# Patient Record
Sex: Female | Born: 1985 | Race: White | Hispanic: No | Marital: Married | Smoking: Never smoker
Health system: Southern US, Community
[De-identification: ages and names within clinical notes are randomized; demographics above are authoritative.]

## PROBLEM LIST (undated history)

## (undated) DIAGNOSIS — R87629 Unspecified abnormal cytological findings in specimens from vagina: Secondary | ICD-10-CM

## (undated) HISTORY — DX: Unspecified abnormal cytological findings in specimens from vagina: R87.629

## (undated) HISTORY — PX: NO PAST SURGERIES: SHX2092

---

## 2016-01-12 NOTE — L&D Delivery Note (Signed)
Delivery Note At 11:46 PM a viable and healthy female was delivered via Vaginal, Spontaneous Delivery (Presentation:ROA ;  ).  APGAR: 8, 9; weight pending   .   Placenta status: intact, sent to OB.  Cord:  3 vessel with the following complications: significant lacerations. .   Anesthesia:  Epidural Episiotomy:  None Lacerations:  Complex 3rd degree perineal, 2nd vaginal, vulvar tear clitoris to vagina. Repaired in the usual fashion including rectal sphincter  Suture Repair: Monocryl  Est. Blood Loss (mL):  900  Mom to postpartum.  Baby to Couplet care / Skin to Skin.  [name] 08/21/2016, 12:43 AM  Patient is a G1 at 3635w2d who was admitted with SOL, essentially uncomplicated prenatal course.  She progressed with augmentation via AROM and then Pit at the very end of pushing.  I was gloved and present for delivery in its entirety.  Second stage of labor progressed, baby delivered after approx 2 hrs of pushing.  Mild decels during second stage noted.  Complications: none  Lacerations: see note above; repaired by Dr Despina HiddenEure  EBL: 900cc  [name], [name], CNM 3:18 AM 08/21/2016

## 2016-01-16 ENCOUNTER — Ambulatory Visit (INDEPENDENT_AMBULATORY_CARE_PROVIDER_SITE_OTHER): Payer: Managed Care, Other (non HMO) | Admitting: Family

## 2016-01-16 ENCOUNTER — Encounter: Payer: Self-pay | Admitting: Family

## 2016-01-16 VITALS — BP 114/87 | HR 88 | Ht 61.0 in | Wt 187.0 lb

## 2016-01-16 DIAGNOSIS — O34219 Maternal care for unspecified type scar from previous cesarean delivery: Secondary | ICD-10-CM

## 2016-01-16 DIAGNOSIS — Z3490 Encounter for supervision of normal pregnancy, unspecified, unspecified trimester: Secondary | ICD-10-CM | POA: Insufficient documentation

## 2016-01-16 DIAGNOSIS — O9989 Other specified diseases and conditions complicating pregnancy, childbirth and the puerperium: Secondary | ICD-10-CM

## 2016-01-16 DIAGNOSIS — Z124 Encounter for screening for malignant neoplasm of cervix: Secondary | ICD-10-CM

## 2016-01-16 DIAGNOSIS — O3440 Maternal care for other abnormalities of cervix, unspecified trimester: Secondary | ICD-10-CM | POA: Insufficient documentation

## 2016-01-16 DIAGNOSIS — O3441 Maternal care for other abnormalities of cervix, first trimester: Secondary | ICD-10-CM

## 2016-01-16 DIAGNOSIS — Z3689 Encounter for other specified antenatal screening: Secondary | ICD-10-CM | POA: Diagnosis not present

## 2016-01-16 DIAGNOSIS — O09899 Supervision of other high risk pregnancies, unspecified trimester: Secondary | ICD-10-CM

## 2016-01-16 DIAGNOSIS — Z283 Underimmunization status: Secondary | ICD-10-CM

## 2016-01-16 DIAGNOSIS — Z3491 Encounter for supervision of normal pregnancy, unspecified, first trimester: Secondary | ICD-10-CM | POA: Diagnosis not present

## 2016-01-16 DIAGNOSIS — Z9889 Other specified postprocedural states: Secondary | ICD-10-CM

## 2016-01-16 DIAGNOSIS — Z2839 Other underimmunization status: Secondary | ICD-10-CM

## 2016-01-16 DIAGNOSIS — Z113 Encounter for screening for infections with a predominantly sexual mode of transmission: Secondary | ICD-10-CM

## 2016-01-16 DIAGNOSIS — Z1151 Encounter for screening for human papillomavirus (HPV): Secondary | ICD-10-CM | POA: Diagnosis not present

## 2016-01-16 DIAGNOSIS — Z3401 Encounter for supervision of normal first pregnancy, first trimester: Secondary | ICD-10-CM

## 2016-01-16 LAB — HEMOGLOBIN A1C
Hgb A1c MFr Bld: 4.8 % (ref <5.7)
MEAN PLASMA GLUCOSE: 91 mg/dL

## 2016-01-16 LAB — GLUCOSE, RANDOM: GLUCOSE: 84 mg/dL (ref 65–99)

## 2016-01-16 MED ORDER — DOXYLAMINE-PYRIDOXINE 10-10 MG PO TBEC: DELAYED_RELEASE_TABLET | ORAL | 1 refills | Status: DC

## 2016-01-16 NOTE — Patient Instructions (Addendum)
First Trimester of Pregnancy  The first trimester of pregnancy is from week 1 until the end of week 12 (months 1 through 3). A week after a sperm fertilizes an egg, the egg will implant on the wall of the uterus. This embryo will begin to develop into a baby. Genes from you and your partner are forming the baby. The female genes determine whether the baby is a boy or a girl. At 6-8 weeks, the eyes and face are formed, and the heartbeat can be seen on ultrasound. At the end of 12 weeks, all the baby's organs are formed.   Now that you are pregnant, you will want to do everything you can to have a healthy baby. Two of the most important things are to get good prenatal care and to follow your health care provider's instructions. Prenatal care is all the medical care you receive before the baby's birth. This care will help prevent, find, and treat any problems during the pregnancy and childbirth.  BODY CHANGES  Your body goes through many changes during pregnancy. The changes vary from woman to woman.   · You may gain or lose a couple of pounds at first.  · You may feel sick to your stomach (nauseous) and throw up (vomit). If the vomiting is uncontrollable, call your health care provider.  · You may tire easily.  · You may develop headaches that can be relieved by medicines approved by your health care provider.  · You may urinate more often. Painful urination may mean you have a bladder infection.  · You may develop heartburn as a result of your pregnancy.  · You may develop constipation because certain hormones are causing the muscles that push waste through your intestines to slow down.  · You may develop hemorrhoids or swollen, bulging veins (varicose veins).  · Your breasts may begin to grow larger and become tender. Your nipples may stick out more, and the tissue that surrounds them (areola) may become darker.  · Your gums may bleed and may be sensitive to brushing and flossing.   · Dark spots or blotches (chloasma, mask of pregnancy) may develop on your face. This will likely fade after the baby is born.  · Your menstrual periods will stop.  · You may have a loss of appetite.  · You may develop cravings for certain kinds of food.  · You may have changes in your emotions from day to day, such as being excited to be pregnant or being concerned that something may go wrong with the pregnancy and baby.  · You may have more vivid and strange dreams.  · You may have changes in your hair. These can include thickening of your hair, rapid growth, and changes in texture. Some women also have hair loss during or after pregnancy, or hair that feels dry or thin. Your hair will most likely return to normal after your baby is born.  WHAT TO EXPECT AT YOUR PRENATAL VISITS  During a routine prenatal visit:  · You will be weighed to make sure you and the baby are growing normally.  · Your blood pressure will be taken.  · Your abdomen will be measured to track your baby's growth.  · The fetal heartbeat will be listened to starting around week 10 or 12 of your pregnancy.  · Test results from any previous visits will be discussed.  Your health care provider may ask you:  · How you are feeling.  · If you   are feeling the baby move.  · If you have had any abnormal symptoms, such as leaking fluid, bleeding, severe headaches, or abdominal cramping.  · If you are using any tobacco products, including cigarettes, chewing tobacco, and electronic cigarettes.  · If you have any questions.  Other tests that may be performed during your first trimester include:  · Blood tests to find your blood type and to check for the presence of any previous infections. They will also be used to check for low iron levels (anemia) and Rh antibodies. Later in the pregnancy, blood tests for diabetes will be done along with other tests if problems develop.  · Urine tests to check for infections, diabetes, or protein in the urine.   · An ultrasound to confirm the proper growth and development of the baby.  · An amniocentesis to check for possible genetic problems.  · Fetal screens for spina bifida and Down syndrome.  · You may need other tests to make sure you and the baby are doing well.  · HIV (human immunodeficiency virus) testing. Routine prenatal testing includes screening for HIV, unless you choose not to have this test.  HOME CARE INSTRUCTIONS   Medicines  · Follow your health care provider's instructions regarding medicine use. Specific medicines may be either safe or unsafe to take during pregnancy.  · Take your prenatal vitamins as directed.  · If you develop constipation, try taking a stool softener if your health care provider approves.  Diet  · Eat regular, well-balanced meals. Choose a variety of foods, such as meat or vegetable-based protein, fish, milk and low-fat dairy products, vegetables, fruits, and whole grain breads and cereals. Your health care provider will help you determine the amount of weight gain that is right for you.  · Avoid raw meat and uncooked cheese. These carry germs that can cause birth defects in the baby.  · Eating four or five small meals rather than three large meals a day may help relieve nausea and vomiting. If you start to feel nauseous, eating a few soda crackers can be helpful. Drinking liquids between meals instead of during meals also seems to help nausea and vomiting.  · If you develop constipation, eat more high-fiber foods, such as fresh vegetables or fruit and whole grains. Drink enough fluids to keep your urine clear or pale yellow.  Activity and Exercise  · Exercise only as directed by your health care provider. Exercising will help you:    Control your weight.    Stay in shape.    Be prepared for labor and delivery.  · Experiencing pain or cramping in the lower abdomen or low back is a good sign that you should stop exercising. Check with your health care provider  before continuing normal exercises.  · Try to avoid standing for long periods of time. Move your legs often if you must stand in one place for a long time.  · Avoid heavy lifting.  · Wear low-heeled shoes, and practice good posture.  · You may continue to have sex unless your health care provider directs you otherwise.  Relief of Pain or Discomfort  · Wear a good support bra for breast tenderness.    · Take warm sitz baths to soothe any pain or discomfort caused by hemorrhoids. Use hemorrhoid cream if your health care provider approves.    · Rest with your legs elevated if you have leg cramps or low back pain.  · If you develop varicose veins in your   legs, wear support hose. Elevate your feet for 15 minutes, 3-4 times a day. Limit salt in your diet.  Prenatal Care  · Schedule your prenatal visits by the twelfth week of pregnancy. They are usually scheduled monthly at first, then more often in the last 2 months before delivery.  · Write down your questions. Take them to your prenatal visits.  · Keep all your prenatal visits as directed by your health care provider.  Safety  · Wear your seat belt at all times when driving.  · Make a list of emergency phone numbers, including numbers for family, friends, the hospital, and police and fire departments.  General Tips  · Ask your health care provider for a referral to a local prenatal education class. Begin classes no later than at the beginning of month 6 of your pregnancy.  · Ask for help if you have counseling or nutritional needs during pregnancy. Your health care provider can offer advice or refer you to specialists for help with various needs.  · Do not use hot tubs, steam rooms, or saunas.  · Do not douche or use tampons or scented sanitary pads.  · Do not cross your legs for long periods of time.  · Avoid cat litter boxes and soil used by cats. These carry germs that can cause birth defects in the baby and possibly loss of the fetus by miscarriage or stillbirth.   · Avoid all smoking, herbs, alcohol, and medicines not prescribed by your health care provider. Chemicals in these affect the formation and growth of the baby.  · Do not use any tobacco products, including cigarettes, chewing tobacco, and electronic cigarettes. If you need help quitting, ask your health care provider. You may receive counseling support and other resources to help you quit.  · Schedule a dentist appointment. At home, brush your teeth with a soft toothbrush and be gentle when you floss.  SEEK MEDICAL CARE IF:   · You have dizziness.  · You have mild pelvic cramps, pelvic pressure, or nagging pain in the abdominal area.  · You have persistent nausea, vomiting, or diarrhea.  · You have a bad smelling vaginal discharge.  · You have pain with urination.  · You notice increased swelling in your face, hands, legs, or ankles.  SEEK IMMEDIATE MEDICAL CARE IF:   · You have a fever.  · You are leaking fluid from your vagina.  · You have spotting or bleeding from your vagina.  · You have severe abdominal cramping or pain.  · You have rapid weight gain or loss.  · You vomit blood or material that looks like coffee grounds.  · You are exposed to German measles and have never had them.  · You are exposed to fifth disease or chickenpox.  · You develop a severe headache.  · You have shortness of breath.  · You have any kind of trauma, such as from a fall or a car accident.     This information is not intended to replace advice given to you by your health care provider. Make sure you discuss any questions you have with your health care provider.     Document Released: 12/22/2000 Document Revised: 01/18/2014 Document Reviewed: 11/07/2012  Elsevier Interactive Patient Education ©2017 [employer]

## 2016-01-16 NOTE — Progress Notes (Signed)
  Subjective:    Renee SimmondsRebecca Cove is a G1P0 28101w2d being seen today for her first obstetrical visit.  Pregnancy dated by LMP, confirmed by bedside US today.  Here with FOB Ryan.  Her obstetrical history is significant for history of LEEP. Patient does intend to breast feed. Pregnancy history fully reviewed.  Patient reports nausea and vomiting.  Vitals:   01/16/16 0910 01/16/16 0915  BP: 114/87   Pulse: 88   Weight: 187 lb (84.8 kg)   Height:  5\' 1"  (1.549 m)    HISTORY: OB History  Gravida Para Term Preterm AB Living  1            SAB TAB Ectopic Multiple Live Births               # Outcome Date GA Lbr Len/2nd Weight Sex Delivery Anes PTL Lv  1 Current              Past Medical History:  Diagnosis Date  . Vaginal Pap smear, abnormal    Leep   History reviewed. No pertinent surgical history. Family History  Problem Relation Age of Onset  . Prostate cancer Father   . Diabetes Father   . Alzheimer's disease Maternal Grandmother   . Prostate cancer Maternal Grandfather   . Skin cancer Maternal Grandfather   . Deep vein thrombosis Paternal Grandfather   . Ovarian cancer Maternal Aunt   . Lung cancer Maternal Aunt      Exam   BP 114/87   Pulse 88   Ht 5\' 1"  (1.549 m)   Wt 187 lb (84.8 kg)   LMP 11/12/2015   BMI 35.33 kg/m  Uterine Size: size equals dates  Pelvic Exam:    Perineum: No Hemorrhoids, Normal Perineum   Vulva: normal   Vagina:  normal mucosa, normal discharge, no palpable nodules   pH: Not done   Cervix: no bleeding following Pap, no cervical motion tenderness and no lesions; scarring felt around 5 oclock   Adnexa: normal adnexa and no mass, fullness, tenderness   Bony Pelvis: Adequate  System: Breast:  No nipple retraction or dimpling, No nipple discharge or bleeding, No axillary or supraclavicular adenopathy, Normal to palpation without dominant masses   Skin: normal coloration and turgor, no rashes    Neurologic: negative   Extremities: normal  strength, tone, and muscle mass   HEENT neck supple with midline trachea and thyroid without masses   Mouth/Teeth mucous membranes moist, pharynx normal without lesions   Neck supple and no masses   Cardiovascular: regular rate and rhythm, no murmurs or gallops   Respiratory:  appears well, vitals normal, no respiratory distress, acyanotic, normal RR, neck free of mass or lymphadenopathy, chest clear, no wheezing, crepitations, rhonchi, normal symmetric air entry   Abdomen: soft, non-tender; bowel sounds normal; no masses,  no organomegaly   Urinary: urethral meatus normal     Assessment:    Pregnancy: G1P0 Patient Active Problem List   Diagnosis Date Noted  . Normal pregnancy 01/16/2016  . History of cervical LEEP biopsy affecting care of mother, antepartum 01/16/2016        Plan:     Initial labs drawn.  First Screen ordered. Prenatal vitamins. Problem list reviewed and updated. Genetic Screening discussed First Screen: ordered. Follow up in 4 weeks.  Eino FarberWalidah Elenora FenderKarim 01/16/2016

## 2016-01-16 NOTE — Progress Notes (Signed)
Bedside U/S shows IUP with FHT of 158 BPM and GA is 5164w3d

## 2016-01-16 NOTE — Progress Notes (Signed)
Last pap 1/17 WNL but does have H/O Leep 7/16

## 2016-01-18 LAB — CULTURE, OB URINE
Colony Count: NO GROWTH
Organism ID, Bacteria: NO GROWTH

## 2016-01-19 LAB — PRENATAL PROFILE (SOLSTAS)
Antibody Screen: NEGATIVE
BASOS ABS: 0 {cells}/uL (ref 0–200)
Basophils Relative: 0 %
EOS ABS: 109 {cells}/uL (ref 15–500)
Eosinophils Relative: 1 %
HCT: 40.2 % (ref 35.0–45.0)
HIV: NONREACTIVE
Hemoglobin: 13.3 g/dL (ref 11.7–15.5)
Hepatitis B Surface Ag: NEGATIVE
LYMPHS PCT: 17 %
Lymphs Abs: 1853 cells/uL (ref 850–3900)
MCH: 30.4 pg (ref 27.0–33.0)
MCHC: 33.1 g/dL (ref 32.0–36.0)
MCV: 91.8 fL (ref 80.0–100.0)
MONO ABS: 545 {cells}/uL (ref 200–950)
MPV: 9.6 fL (ref 7.5–12.5)
Monocytes Relative: 5 %
NEUTROS PCT: 77 %
Neutro Abs: 8393 cells/uL — ABNORMAL HIGH (ref 1500–7800)
PLATELETS: 310 10*3/uL (ref 140–400)
RBC: 4.38 MIL/uL (ref 3.80–5.10)
RDW: 13.1 % (ref 11.0–15.0)
RH TYPE: POSITIVE
Rubella: <0.9 Index (ref <0.90)
WBC: 10.9 10*3/uL — ABNORMAL HIGH (ref 3.8–10.8)

## 2016-01-19 LAB — CYTOLOGY - PAP
CHLAMYDIA, DNA PROBE: NEGATIVE
DIAGNOSIS: NEGATIVE
HPV (WINDOPATH): NOT DETECTED
NEISSERIA GONORRHEA: NEGATIVE

## 2016-01-23 LAB — CYSTIC FIBROSIS DIAGNOSTIC STUDY

## 2016-01-30 ENCOUNTER — Encounter (HOSPITAL_COMMUNITY): Payer: Self-pay | Admitting: Family

## 2016-02-11 ENCOUNTER — Encounter (HOSPITAL_COMMUNITY): Payer: Self-pay

## 2016-02-11 ENCOUNTER — Ambulatory Visit (HOSPITAL_COMMUNITY)
Admission: RE | Admit: 2016-02-11 | Discharge: 2016-02-11 | Disposition: A | Discharge status: Home / Self Care | Payer: Managed Care, Other (non HMO) | Source: Ambulatory Visit | Attending: Family | Admitting: Family

## 2016-02-11 DIAGNOSIS — Z3491 Encounter for supervision of normal pregnancy, unspecified, first trimester: Secondary | ICD-10-CM

## 2016-02-11 DIAGNOSIS — Z3A13 13 weeks gestation of pregnancy: Secondary | ICD-10-CM | POA: Insufficient documentation

## 2016-02-11 DIAGNOSIS — Z3682 Encounter for antenatal screening for nuchal translucency: Secondary | ICD-10-CM | POA: Diagnosis present

## 2016-02-13 ENCOUNTER — Ambulatory Visit (INDEPENDENT_AMBULATORY_CARE_PROVIDER_SITE_OTHER): Payer: Managed Care, Other (non HMO) | Admitting: Family

## 2016-02-13 ENCOUNTER — Other Ambulatory Visit: Payer: Self-pay

## 2016-02-13 VITALS — BP 117/81 | HR 83 | Wt 184.0 lb

## 2016-02-13 DIAGNOSIS — Z363 Encounter for antenatal screening for malformations: Secondary | ICD-10-CM

## 2016-02-13 DIAGNOSIS — Z3492 Encounter for supervision of normal pregnancy, unspecified, second trimester: Secondary | ICD-10-CM

## 2016-02-13 DIAGNOSIS — Z3402 Encounter for supervision of normal first pregnancy, second trimester: Secondary | ICD-10-CM

## 2016-02-13 NOTE — Progress Notes (Signed)
   PRENATAL VISIT NOTE  Subjective:  Renee Orozco is a 31 y.o. G1P0 at 37w2dbeing seen today for ongoing prenatal care.  She is currently monitored for the following issues for this high-risk pregnancy and has Normal pregnancy and History of cervical LEEP biopsy affecting care of mother, antepartum on her problem list.  Patient reports nausea.   Notices the difference when she does not take Diclegis.  Taking 2 at night only.  Feels like appetite is returning  . Vag. Bleeding: None.   . Denies leaking of fluid.   The following portions of the patient's history were reviewed and updated as appropriate: allergies, current medications, past family history, past medical history, past social history, past surgical history and problem list. Problem list updated.  Objective:   Vitals:   02/13/16 0825  BP: 117/81  Pulse: 83  Weight: 184 lb (83.5 kg)    Fetal Status: Fetal Heart Rate (bpm): 152         General:  Alert, oriented and cooperative. Patient is in no acute distress.  Skin: Skin is warm and dry. No rash noted.   Cardiovascular: Normal heart rate noted  Respiratory: Normal respiratory effort, no problems with respiration noted  Abdomen: Soft, gravid, appropriate for gestational age.       Pelvic:  Cervical exam deferred        Extremities: Normal range of motion.     Mental Status: Normal mood and affect. Normal behavior. Normal judgment and thought content.   Assessment and Plan:  Pregnancy: G1P0 at 153w2d1. Encounter for supervision of normal first pregnancy in second trimester - [place] COMP + 14 WK; Future - Reviewed OB lab results including being non rubella-immune and needing MMR postpartum - Reviewed NT results, need AFP at next visit  2. Encounter for antenatal screening for malformation using ultrasound - [place] COMP + 1422K; Future   General obstetric precautions including but not limited to vaginal bleeding and pelvic pain reviewed in detail with the  patient. Please refer to After Visit Summary for other counseling recommendations.  Return in about 4 weeks (around 03/12/2016).   WaVenia Carbon Michiel CowboyCNM

## 2016-02-17 ENCOUNTER — Encounter: Payer: Self-pay | Admitting: *Deleted

## 2016-02-19 LAB — PAIN MGMT, PROFILE 6 CONF W/O MM, U

## 2016-02-20 DIAGNOSIS — O09899 Supervision of other high risk pregnancies, unspecified trimester: Secondary | ICD-10-CM | POA: Insufficient documentation

## 2016-02-20 DIAGNOSIS — Z283 Underimmunization status: Secondary | ICD-10-CM | POA: Insufficient documentation

## 2016-02-20 DIAGNOSIS — O9989 Other specified diseases and conditions complicating pregnancy, childbirth and the puerperium: Secondary | ICD-10-CM

## 2016-02-20 DIAGNOSIS — Z2839 Other underimmunization status: Secondary | ICD-10-CM | POA: Insufficient documentation

## 2016-03-07 ENCOUNTER — Telehealth: Payer: Managed Care, Other (non HMO) | Admitting: Family

## 2016-03-07 DIAGNOSIS — R112 Nausea with vomiting, unspecified: Secondary | ICD-10-CM

## 2016-03-07 MED ORDER — ONDANSETRON 4 MG PO TBDP: 4.0000 mg | ORAL_TABLET | Freq: Three times a day (TID) | ORAL | 0 refills | Status: DC | PRN

## 2016-03-07 NOTE — Progress Notes (Signed)

## 2016-03-12 ENCOUNTER — Ambulatory Visit (INDEPENDENT_AMBULATORY_CARE_PROVIDER_SITE_OTHER): Payer: Managed Care, Other (non HMO) | Admitting: Advanced Practice Midwife

## 2016-03-12 VITALS — BP 106/71 | HR 72 | Wt 187.0 lb

## 2016-03-12 DIAGNOSIS — Z3492 Encounter for supervision of normal pregnancy, unspecified, second trimester: Secondary | ICD-10-CM

## 2016-03-12 DIAGNOSIS — Z3402 Encounter for supervision of normal first pregnancy, second trimester: Secondary | ICD-10-CM

## 2016-03-12 NOTE — Patient Instructions (Signed)

## 2016-03-12 NOTE — Progress Notes (Signed)
   PRENATAL VISIT NOTE  Subjective:  Renee SimmondsRebecca Orozco is a 31 y.o. G1P0 at 6413w2d being seen today for ongoing prenatal care.  She is currently monitored for the following issues for this low-risk pregnancy and has Normal pregnancy; History of cervical LEEP biopsy affecting care of mother, antepartum; and Rubella non-immune status, antepartum on her problem list.  Patient reports no complaints.  Contractions: Not present. Vag. Bleeding: None.  Movement: Present. Denies leaking of fluid.   The following portions of the patient's history were reviewed and updated as appropriate: allergies, current medications, past family history, past medical history, past social history, past surgical history and problem list. Problem list updated.  Objective:   Vitals:   03/12/16 0808  BP: 106/71  Pulse: 72  Weight: 187 lb (84.8 kg)    Fetal Status: Fetal Heart Rate (bpm): 140 Fundal Height: 18 cm Movement: Present     General:  Alert, oriented and cooperative. Patient is in no acute distress.  Skin: Skin is warm and dry. No rash noted.   Cardiovascular: Normal heart rate noted  Respiratory: Normal respiratory effort, no problems with respiration noted  Abdomen: Soft, gravid, appropriate for gestational age. Pain/Pressure: Absent     Pelvic:  Cervical exam deferred        Extremities: Normal range of motion.  Edema: None  Mental Status: Normal mood and affect. Normal behavior. Normal judgment and thought content.   Assessment and Plan:  Pregnancy: G1P0 at 7013w2d  1. Normal pregnancy in second trimester  - Alpha fetoprotein, maternal  Preterm labor symptoms and general obstetric precautions including but not limited to vaginal bleeding, contractions, leaking of fluid and fetal movement were reviewed in detail with the patient. Please refer to After Visit Summary for other counseling recommendations.  Anatomy scan scheduled for 03/24/16.    Return in about 4 weeks (around 04/09/2016) for  ROB.   Dorathy KinsmanVirginia Keerat Denicola, CNM

## 2016-03-17 ENCOUNTER — Encounter: Payer: Self-pay | Admitting: *Deleted

## 2016-03-17 LAB — ALPHA FETOPROTEIN, MATERNAL
AFP: 25.5 ng/mL
Curr Gest Age: 17.3 weeks
MoM for AFP: 0.72
Open Spina bifida: NEGATIVE

## 2016-03-24 ENCOUNTER — Encounter: Payer: Self-pay | Admitting: Family

## 2016-03-24 ENCOUNTER — Ambulatory Visit (HOSPITAL_COMMUNITY)
Admission: RE | Admit: 2016-03-24 | Discharge: 2016-03-24 | Disposition: A | Discharge status: Home / Self Care | Payer: Managed Care, Other (non HMO) | Source: Ambulatory Visit | Attending: Family | Admitting: Family

## 2016-03-24 ENCOUNTER — Other Ambulatory Visit: Payer: Self-pay | Admitting: Family

## 2016-03-24 DIAGNOSIS — O4440 Low lying placenta NOS or without hemorrhage, unspecified trimester: Secondary | ICD-10-CM

## 2016-03-24 DIAGNOSIS — Z363 Encounter for antenatal screening for malformations: Secondary | ICD-10-CM

## 2016-03-24 DIAGNOSIS — Z3A19 19 weeks gestation of pregnancy: Secondary | ICD-10-CM | POA: Insufficient documentation

## 2016-03-24 DIAGNOSIS — O3442 Maternal care for other abnormalities of cervix, second trimester: Secondary | ICD-10-CM | POA: Diagnosis not present

## 2016-03-24 DIAGNOSIS — Z3402 Encounter for supervision of normal first pregnancy, second trimester: Secondary | ICD-10-CM

## 2016-03-24 DIAGNOSIS — Z8279 Family history of other congenital malformations, deformations and chromosomal abnormalities: Secondary | ICD-10-CM | POA: Diagnosis not present

## 2016-03-30 ENCOUNTER — Encounter: Payer: Self-pay | Admitting: Obstetrics & Gynecology

## 2016-04-08 ENCOUNTER — Ambulatory Visit (INDEPENDENT_AMBULATORY_CARE_PROVIDER_SITE_OTHER): Payer: Managed Care, Other (non HMO) | Admitting: Obstetrics & Gynecology

## 2016-04-08 ENCOUNTER — Encounter: Payer: Managed Care, Other (non HMO) | Admitting: Obstetrics & Gynecology

## 2016-04-08 VITALS — BP 105/70 | HR 83 | Wt 193.0 lb

## 2016-04-08 DIAGNOSIS — Z3402 Encounter for supervision of normal first pregnancy, second trimester: Secondary | ICD-10-CM

## 2016-04-08 DIAGNOSIS — Z3492 Encounter for supervision of normal pregnancy, unspecified, second trimester: Secondary | ICD-10-CM

## 2016-04-08 NOTE — Progress Notes (Signed)
   PRENATAL VISIT NOTE  Subjective:  Renee SimmondsRebecca Orozco is a 31 y.o. MW G1P0 at 7548w1d being seen today for ongoing prenatal care.  She is currently monitored for the following issues for this low-risk pregnancy and has Normal pregnancy; History of cervical LEEP biopsy affecting care of mother, antepartum; Rubella non-immune status, antepartum; and Low lying placenta, antepartum on her problem list.  Patient reports no complaints.  Contractions: Not present. Vag. Bleeding: None.  Movement: Present. Denies leaking of fluid.   The following portions of the patient's history were reviewed and updated as appropriate: allergies, current medications, past family history, past medical history, past social history, past surgical history and problem list. Problem list updated.  Objective:   Vitals:   04/08/16 1302  BP: 105/70  Pulse: 83  Weight: 193 lb (87.5 kg)    Fetal Status: Fetal Heart Rate (bpm): 136   Movement: Present     General:  Alert, oriented and cooperative. Patient is in no acute distress.  Skin: Skin is warm and dry. No rash noted.   Cardiovascular: Normal heart rate noted  Respiratory: Normal respiratory effort, no problems with respiration noted  Abdomen: Soft, gravid, appropriate for gestational age. Pain/Pressure: Absent     Pelvic:  Cervical exam deferred        Extremities: Normal range of motion.  Edema: None  Mental Status: Normal mood and affect. Normal behavior. Normal judgment and thought content.   Assessment and Plan:  Pregnancy: G1P0 at 3948w1d  1. Normal pregnancy in second trimester   Preterm labor symptoms and general obstetric precautions including but not limited to vaginal bleeding, contractions, leaking of fluid and fetal movement were reviewed in detail with the patient. Please refer to After Visit Summary for other counseling recommendations.  No Follow-up on file.   Allie BossierMyra C Enriqueta Augusta, MD

## 2016-04-21 ENCOUNTER — Other Ambulatory Visit (HOSPITAL_COMMUNITY): Payer: Self-pay | Admitting: Maternal and Fetal Medicine

## 2016-04-21 ENCOUNTER — Ambulatory Visit (HOSPITAL_COMMUNITY)
Admission: RE | Admit: 2016-04-21 | Discharge: 2016-04-21 | Disposition: A | Discharge status: Home / Self Care | Payer: Managed Care, Other (non HMO) | Source: Ambulatory Visit | Attending: Family | Admitting: Family

## 2016-04-21 DIAGNOSIS — Z3A23 23 weeks gestation of pregnancy: Secondary | ICD-10-CM

## 2016-04-21 DIAGNOSIS — Z8279 Family history of other congenital malformations, deformations and chromosomal abnormalities: Secondary | ICD-10-CM

## 2016-04-21 DIAGNOSIS — Z0489 Encounter for examination and observation for other specified reasons: Secondary | ICD-10-CM

## 2016-04-21 DIAGNOSIS — O4440 Low lying placenta NOS or without hemorrhage, unspecified trimester: Secondary | ICD-10-CM

## 2016-04-21 DIAGNOSIS — IMO0002 Reserved for concepts with insufficient information to code with codable children: Secondary | ICD-10-CM

## 2016-04-21 DIAGNOSIS — Z048 Encounter for examination and observation for other specified reasons: Secondary | ICD-10-CM | POA: Insufficient documentation

## 2016-04-21 DIAGNOSIS — O3442 Maternal care for other abnormalities of cervix, second trimester: Secondary | ICD-10-CM | POA: Insufficient documentation

## 2016-04-21 DIAGNOSIS — O4442 Low lying placenta NOS or without hemorrhage, second trimester: Secondary | ICD-10-CM | POA: Diagnosis present

## 2016-05-06 ENCOUNTER — Ambulatory Visit (INDEPENDENT_AMBULATORY_CARE_PROVIDER_SITE_OTHER): Payer: Managed Care, Other (non HMO) | Admitting: Obstetrics & Gynecology

## 2016-05-06 VITALS — BP 122/78 | HR 88 | Wt 197.0 lb

## 2016-05-06 DIAGNOSIS — Z3492 Encounter for supervision of normal pregnancy, unspecified, second trimester: Secondary | ICD-10-CM

## 2016-06-02 ENCOUNTER — Ambulatory Visit (INDEPENDENT_AMBULATORY_CARE_PROVIDER_SITE_OTHER): Payer: Managed Care, Other (non HMO) | Admitting: Obstetrics & Gynecology

## 2016-06-02 VITALS — BP 112/69 | HR 87 | Wt 200.0 lb

## 2016-06-02 DIAGNOSIS — Z23 Encounter for immunization: Secondary | ICD-10-CM

## 2016-06-02 DIAGNOSIS — O3442 Maternal care for other abnormalities of cervix, second trimester: Secondary | ICD-10-CM

## 2016-06-02 DIAGNOSIS — O4442 Low lying placenta NOS or without hemorrhage, second trimester: Secondary | ICD-10-CM

## 2016-06-02 DIAGNOSIS — Z9889 Other specified postprocedural states: Secondary | ICD-10-CM

## 2016-06-02 DIAGNOSIS — O3440 Maternal care for other abnormalities of cervix, unspecified trimester: Secondary | ICD-10-CM

## 2016-06-02 DIAGNOSIS — O9989 Other specified diseases and conditions complicating pregnancy, childbirth and the puerperium: Secondary | ICD-10-CM

## 2016-06-02 DIAGNOSIS — Z283 Underimmunization status: Secondary | ICD-10-CM

## 2016-06-02 DIAGNOSIS — Z3A29 29 weeks gestation of pregnancy: Secondary | ICD-10-CM

## 2016-06-02 DIAGNOSIS — O4440 Low lying placenta NOS or without hemorrhage, unspecified trimester: Secondary | ICD-10-CM

## 2016-06-02 DIAGNOSIS — Z2839 Other underimmunization status: Secondary | ICD-10-CM

## 2016-06-02 DIAGNOSIS — O09899 Supervision of other high risk pregnancies, unspecified trimester: Secondary | ICD-10-CM

## 2016-06-02 DIAGNOSIS — Z3492 Encounter for supervision of normal pregnancy, unspecified, second trimester: Secondary | ICD-10-CM

## 2016-06-02 LAB — CBC
HCT: 34.4 % — ABNORMAL LOW (ref 35.0–45.0)
HEMOGLOBIN: 11.4 g/dL — AB (ref 11.7–15.5)
MCH: 29.9 pg (ref 27.0–33.0)
MCHC: 33.1 g/dL (ref 32.0–36.0)
MCV: 90.3 fL (ref 80.0–100.0)
MPV: 9.9 fL (ref 7.5–12.5)
Platelets: 243 10*3/uL (ref 140–400)
RBC: 3.81 MIL/uL (ref 3.80–5.10)
RDW: 13.6 % (ref 11.0–15.0)
WBC: 9.7 10*3/uL (ref 3.8–10.8)

## 2016-06-02 NOTE — Progress Notes (Signed)
   PRENATAL VISIT NOTE  Subjective:  Renee Orozco is a 31 y.o. G1P0 at 183w0d being seen today for ongoing prenatal care.  She is currently monitored for the following issues for this low-risk pregnancy and has Normal pregnancy; History of cervical LEEP biopsy affecting care of mother, antepartum; Rubella non-immune status, antepartum; and Low lying placenta, antepartum on her problem list.  Patient reports no complaints.  Contractions: Not present. Vag. Bleeding: None.  Movement: Present. Denies leaking of fluid.   The following portions of the patient's history were reviewed and updated as appropriate: allergies, current medications, past family history, past medical history, past social history, past surgical history and problem list. Problem list updated.  Objective:   Vitals:   06/02/16 0835  BP: 112/69  Pulse: 87  Weight: 200 lb (90.7 kg)    Fetal Status: Fetal Heart Rate (bpm): 126   Movement: Present     General:  Alert, oriented and cooperative. Patient is in no acute distress.  Skin: Skin is warm and dry. No rash noted.   Cardiovascular: Normal heart rate noted  Respiratory: Normal respiratory effort, no problems with respiration noted  Abdomen: Soft, gravid, appropriate for gestational age. Pain/Pressure: Absent     Pelvic:  Cervical exam deferred        Extremities: Normal range of motion.  Edema: Trace  Mental Status: Normal mood and affect. Normal behavior. Normal judgment and thought content.   Assessment and Plan:  Pregnancy: G1P0 at 133w0d  1. [redacted] weeks gestation of pregnancy  - HIV antibody (with reflex) - CBC - RPR - Glucose Tolerance, 2 Hours w/1 Hour - Tdap vaccine greater than or equal to 7yo IM  2. Normal pregnancy in third trimester  3. Low lying placenta, antepartum resolved  4. History of cervical LEEP biopsy affecting care of mother, antepartum Reviewed labor with pt  5. Rubella non-immune status, antepartum Need vaccine PP  Preterm  labor symptoms and general obstetric precautions including but not limited to vaginal bleeding, contractions, leaking of fluid and fetal movement were reviewed in detail with the patient. Please refer to After Visit Summary for other counseling recommendations.  Return in about 2 weeks (around 06/16/2016).   Willodean Rosenthalarolyn Harraway-Smith, MD

## 2016-06-02 NOTE — Patient Instructions (Signed)
Third Trimester of Pregnancy The third trimester is from week 28 through week 40 (months 7 through 9). The third trimester is a time when the unborn baby (fetus) is growing rapidly. At the end of the ninth month, the fetus is about 20 inches in length and weighs 6-10 pounds. Body changes during your third trimester Your body will continue to go through many changes during pregnancy. The changes vary from woman to woman. During the third trimester:  Your weight will continue to increase. You can expect to gain 25-35 pounds (11-16 kg) by the end of the pregnancy.  You may begin to get stretch marks on your hips, abdomen, and breasts.  You may urinate more often because the fetus is moving lower into your pelvis and pressing on your bladder.  You may develop or continue to have heartburn. This is caused by increased hormones that slow down muscles in the digestive tract.  You may develop or continue to have constipation because increased hormones slow digestion and cause the muscles that push waste through your intestines to relax.  You may develop hemorrhoids. These are swollen veins (varicose veins) in the rectum that can itch or be painful.  You may develop swollen, bulging veins (varicose veins) in your legs.  You may have increased body aches in the pelvis, back, or thighs. This is due to weight gain and increased hormones that are relaxing your joints.  You may have changes in your hair. These can include thickening of your hair, rapid growth, and changes in texture. Some women also have hair loss during or after pregnancy, or hair that feels dry or thin. Your hair will most likely return to normal after your baby is born.  Your breasts will continue to grow and they will continue to become tender. A yellow fluid (colostrum) may leak from your breasts. This is the first milk you are producing for your baby.  Your belly button may stick out.  You may notice more swelling in your hands,  face, or ankles.  You may have increased tingling or numbness in your hands, arms, and legs. The skin on your belly may also feel numb.  You may feel short of breath because of your expanding uterus.  You may have more problems sleeping. This can be caused by the size of your belly, increased need to urinate, and an increase in your body's metabolism.  You may notice the fetus "dropping," or moving lower in your abdomen (lightening).  You may have increased vaginal discharge.  You may notice your joints feel loose and you may have pain around your pelvic bone.  What to expect at prenatal visits You will have prenatal exams every 2 weeks until week 36. Then you will have weekly prenatal exams. During a routine prenatal visit:  You will be weighed to make sure you and the baby are growing normally.  Your blood pressure will be taken.  Your abdomen will be measured to track your baby's growth.  The fetal heartbeat will be listened to.  Any test results from the previous visit will be discussed.  You may have a cervical check near your due date to see if your cervix has softened or thinned (effaced).  You will be tested for Group B streptococcus. This happens between 35 and 37 weeks.  Your health care provider may ask you:  What your birth plan is.  How you are feeling.  If you are feeling the baby move.  If you have had   any abnormal symptoms, such as leaking fluid, bleeding, severe headaches, or abdominal cramping.  If you are using any tobacco products, including cigarettes, chewing tobacco, and electronic cigarettes.  If you have any questions.  Other tests or screenings that may be performed during your third trimester include:  Blood tests that check for low iron levels (anemia).  Fetal testing to check the health, activity level, and growth of the fetus. Testing is done if you have certain medical conditions or if there are problems during the  pregnancy.  Nonstress test (NST). This test checks the health of your baby to make sure there are no signs of problems, such as the baby not getting enough oxygen. During this test, a belt is placed around your belly. The baby is made to move, and its heart rate is monitored during movement.  What is false labor? False labor is a condition in which you feel small, irregular tightenings of the muscles in the womb (contractions) that usually go away with rest, changing position, or drinking water. These are called Braxton Hicks contractions. Contractions may last for hours, days, or even weeks before true labor sets in. If contractions come at regular intervals, become more frequent, increase in intensity, or become painful, you should see your health care provider. What are the signs of labor?  Abdominal cramps.  Regular contractions that start at 10 minutes apart and become stronger and more frequent with time.  Contractions that start on the top of the uterus and spread down to the lower abdomen and back.  Increased pelvic pressure and dull back pain.  A watery or bloody mucus discharge that comes from the vagina.  Leaking of amniotic fluid. This is also known as your "water breaking." It could be a slow trickle or a gush. Let your health care provider know if it has a color or strange odor. If you have any of these signs, call your health care provider right away, even if it is before your due date. Follow these instructions at home: Medicines  Follow your health care provider's instructions regarding medicine use. Specific medicines may be either safe or unsafe to take during pregnancy.  Take a prenatal vitamin that contains at least 600 micrograms (mcg) of folic acid.  If you develop constipation, try taking a stool softener if your health care provider approves. Eating and drinking  Eat a balanced diet that includes fresh fruits and vegetables, whole grains, good sources of protein  such as meat, eggs, or tofu, and low-fat dairy. Your health care provider will help you determine the amount of weight gain that is right for you.  Avoid raw meat and uncooked cheese. These carry germs that can cause birth defects in the baby.  If you have low calcium intake from food, talk to your health care provider about whether you should take a daily calcium supplement.  Eat four or five small meals rather than three large meals a day.  Limit foods that are high in fat and processed sugars, such as fried and sweet foods.  To prevent constipation: ? Drink enough fluid to keep your urine clear or pale yellow. ? Eat foods that are high in fiber, such as fresh fruits and vegetables, whole grains, and beans. Activity  Exercise only as directed by your health care provider. Most women can continue their usual exercise routine during pregnancy. Try to exercise for 30 minutes at least 5 days a week. Stop exercising if you experience uterine contractions.  Avoid heavy   lifting.  Do not exercise in extreme heat or humidity, or at high altitudes.  Wear low-heel, comfortable shoes.  Practice good posture.  You may continue to have sex unless your health care provider tells you otherwise. Relieving pain and discomfort  Take frequent breaks and rest with your legs elevated if you have leg cramps or low back pain.  Take warm sitz baths to soothe any pain or discomfort caused by hemorrhoids. Use hemorrhoid cream if your health care provider approves.  Wear a good support bra to prevent discomfort from breast tenderness.  If you develop varicose veins: ? Wear support pantyhose or compression stockings as told by your healthcare provider. ? Elevate your feet for 15 minutes, 3-4 times a day. Prenatal care  Write down your questions. Take them to your prenatal visits.  Keep all your prenatal visits as told by your health care provider. This is important. Safety  Wear your seat belt at  all times when driving.  Make a list of emergency phone numbers, including numbers for family, friends, the hospital, and police and fire departments. General instructions  Avoid cat litter boxes and soil used by cats. These carry germs that can cause birth defects in the baby. If you have a cat, ask someone to clean the litter box for you.  Do not travel far distances unless it is absolutely necessary and only with the approval of your health care provider.  Do not use hot tubs, steam rooms, or saunas.  Do not drink alcohol.  Do not use any products that contain nicotine or tobacco, such as cigarettes and e-cigarettes. If you need help quitting, ask your health care provider.  Do not use any medicinal herbs or unprescribed drugs. These chemicals affect the formation and growth of the baby.  Do not douche or use tampons or scented sanitary pads.  Do not cross your legs for long periods of time.  To prepare for the arrival of your baby: ? Take prenatal classes to understand, practice, and ask questions about labor and delivery. ? Make a trial run to the hospital. ? Visit the hospital and tour the maternity area. ? Arrange for maternity or paternity leave through employers. ? Arrange for family and friends to take care of pets while you are in the hospital. ? Purchase a rear-facing car seat and make sure you know how to install it in your car. ? Pack your hospital bag. ? Prepare the baby's nursery. Make sure to remove all pillows and stuffed animals from the baby's crib to prevent suffocation.  Visit your dentist if you have not gone during your pregnancy. Use a soft toothbrush to brush your teeth and be gentle when you floss. Contact a health care provider if:  You are unsure if you are in labor or if your water has broken.  You become dizzy.  You have mild pelvic cramps, pelvic pressure, or nagging pain in your abdominal area.  You have lower back pain.  You have persistent  nausea, vomiting, or diarrhea.  You have an unusual or bad smelling vaginal discharge.  You have pain when you urinate. Get help right away if:  Your water breaks before 37 weeks.  You have regular contractions less than 5 minutes apart before 37 weeks.  You have a fever.  You are leaking fluid from your vagina.  You have spotting or bleeding from your vagina.  You have severe abdominal pain or cramping.  You have rapid weight loss or weight gain.    You have shortness of breath with chest pain.  You notice sudden or extreme swelling of your face, hands, ankles, feet, or legs.  Your baby makes fewer than 10 movements in 2 hours.  You have severe headaches that do not go away when you take medicine.  You have vision changes. Summary  The third trimester is from week 28 through week 40, months 7 through 9. The third trimester is a time when the unborn baby (fetus) is growing rapidly.  During the third trimester, your discomfort may increase as you and your baby continue to gain weight. You may have abdominal, leg, and back pain, sleeping problems, and an increased need to urinate.  During the third trimester your breasts will keep growing and they will continue to become tender. A yellow fluid (colostrum) may leak from your breasts. This is the first milk you are producing for your baby.  False labor is a condition in which you feel small, irregular tightenings of the muscles in the womb (contractions) that eventually go away. These are called Braxton Hicks contractions. Contractions may last for hours, days, or even weeks before true labor sets in.  Signs of labor can include: abdominal cramps; regular contractions that start at 10 minutes apart and become stronger and more frequent with time; watery or bloody mucus discharge that comes from the vagina; increased pelvic pressure and dull back pain; and leaking of amniotic fluid. This information is not intended to replace advice  given to you by your health care provider. Make sure you discuss any questions you have with your health care provider. Document Released: 12/22/2000 Document Revised: 06/05/2015 Document Reviewed: 02/29/2012 Elsevier Interactive Patient Education  2017 [employer]  

## 2016-06-03 LAB — GLUCOSE TOLERANCE, 2 HOURS W/ 1HR
GLUCOSE, 2 HOUR: 92 mg/dL (ref <140)
Glucose, 1 hour: 104 mg/dL
Glucose, Fasting: 82 mg/dL (ref 65–99)

## 2016-06-03 LAB — RPR

## 2016-06-03 LAB — HIV ANTIBODY (ROUTINE TESTING W REFLEX): HIV 1&2 Ab, 4th Generation: NONREACTIVE

## 2016-06-08 ENCOUNTER — Other Ambulatory Visit: Payer: Managed Care, Other (non HMO)

## 2016-06-08 NOTE — Progress Notes (Signed)
Pt called stating that she is flying to [place] tonight and wanted her BP checked.  She has been throwing up this morning and seeing spots but denies any HA's.  BP today is 107/58 P-64.  Urine does shows small amount of ketones but neg for protein.  Advised that if pt is still planning to go to [place] to take her Zofran with her.  Pt states that she doesn't feel bad but just wanted to make sure her BP was OK.

## 2016-06-16 ENCOUNTER — Ambulatory Visit (INDEPENDENT_AMBULATORY_CARE_PROVIDER_SITE_OTHER): Payer: Managed Care, Other (non HMO) | Admitting: Obstetrics & Gynecology

## 2016-06-16 DIAGNOSIS — Z3492 Encounter for supervision of normal pregnancy, unspecified, second trimester: Secondary | ICD-10-CM

## 2016-06-16 NOTE — Progress Notes (Signed)
   PRENATAL VISIT NOTE  Subjective:  Renee Orozco is a 31 y.o. G1P0 at 2211w0d being seen today for ongoing prenatal care.  She is currently monitored for the following issues for this low-risk pregnancy and has Normal pregnancy; History of cervical LEEP biopsy affecting care of mother, antepartum; and Rubella non-immune status, antepartum on her problem list.  Patient reports no complaints.  Contractions: Not present. Vag. Bleeding: None.  Movement: Present. Denies leaking of fluid.   The following portions of the patient's history were reviewed and updated as appropriate: allergies, current medications, past family history, past medical history, past social history, past surgical history and problem list. Problem list updated.  Objective:   Vitals:   06/16/16 1056  BP: 105/65  Pulse: 89  Weight: 200 lb (90.7 kg)    Fetal Status: Fetal Heart Rate (bpm): 132 Fundal Height: 31 cm Movement: Present     General:  Alert, oriented and cooperative. Patient is in no acute distress.  Skin: Skin is warm and dry. No rash noted.   Cardiovascular: Normal heart rate noted  Respiratory: Normal respiratory effort, no problems with respiration noted  Abdomen: Soft, gravid, appropriate for gestational age. Pain/Pressure: Absent     Pelvic:  Cervical exam deferred        Extremities: Normal range of motion.  Edema: None  Mental Status: Normal mood and affect. Normal behavior. Normal judgment and thought content.   Assessment and Plan:  Pregnancy: G1P0 at 2011w0d  1. Normal pregnancy in second trimester Weight gain 13 pounds; recommend no more than 20. LLP resolved.  Preterm labor symptoms and general obstetric precautions including but not limited to vaginal bleeding, contractions, leaking of fluid and fetal movement were reviewed in detail with the patient. Please refer to After Visit Summary for other counseling recommendations.  Return in about 2 weeks (around 06/30/2016).   Elsie LincolnKelly Arwilda Georgia,  MD

## 2016-06-30 ENCOUNTER — Encounter: Payer: Managed Care, Other (non HMO) | Admitting: Obstetrics & Gynecology

## 2016-06-30 ENCOUNTER — Ambulatory Visit (INDEPENDENT_AMBULATORY_CARE_PROVIDER_SITE_OTHER): Payer: Managed Care, Other (non HMO) | Admitting: Obstetrics & Gynecology

## 2016-06-30 VITALS — BP 99/69 | HR 73 | Wt 200.0 lb

## 2016-06-30 DIAGNOSIS — Z3493 Encounter for supervision of normal pregnancy, unspecified, third trimester: Secondary | ICD-10-CM

## 2016-06-30 NOTE — Progress Notes (Signed)
   PRENATAL VISIT NOTE  Subjective:  Renee SimmondsRebecca Coyne is a 31 y.o. G1P0 at 9465w0d being seen today for ongoing prenatal care.  She is currently monitored for the following issues for this low-risk pregnancy and has Normal pregnancy; History of cervical LEEP biopsy affecting care of mother, antepartum; and Rubella non-immune status, antepartum on her problem list.  Patient reports no complaints.  Contractions: Not present. Vag. Bleeding: None.  Movement: Present. Denies leaking of fluid.   The following portions of the patient's history were reviewed and updated as appropriate: allergies, current medications, past family history, past medical history, past social history, past surgical history and problem list. Problem list updated.  Objective:   Vitals:   06/30/16 0947  BP: 99/69  Pulse: 73  Weight: 200 lb (90.7 kg)    Fetal Status:     Movement: Present     General:  Alert, oriented and cooperative. Patient is in no acute distress.  Skin: Skin is warm and dry. No rash noted.   Cardiovascular: Normal heart rate noted  Respiratory: Normal respiratory effort, no problems with respiration noted  Abdomen: Soft, gravid, appropriate for gestational age. Pain/Pressure: Absent     Pelvic:  Cervical exam deferred        Extremities: Normal range of motion.  Edema: None  Mental Status: Normal mood and affect. Normal behavior. Normal judgment and thought content.   Assessment and Plan:  Pregnancy: G1P0 at 8565w0d  1. Normal pregnancy in third trimester   Preterm labor symptoms and general obstetric precautions including but not limited to vaginal bleeding, contractions, leaking of fluid and fetal movement were reviewed in detail with the patient. Please refer to After Visit Summary for other counseling recommendations.  No Follow-up on file.   Allie BossierMyra C Matalynn Graff, MD

## 2016-07-15 ENCOUNTER — Encounter: Payer: Managed Care, Other (non HMO) | Admitting: Obstetrics & Gynecology

## 2016-07-16 ENCOUNTER — Ambulatory Visit (INDEPENDENT_AMBULATORY_CARE_PROVIDER_SITE_OTHER): Payer: Managed Care, Other (non HMO) | Admitting: Advanced Practice Midwife

## 2016-07-16 VITALS — BP 113/78 | HR 85 | Wt 204.0 lb

## 2016-07-16 DIAGNOSIS — Z3403 Encounter for supervision of normal first pregnancy, third trimester: Secondary | ICD-10-CM

## 2016-07-16 NOTE — Patient Instructions (Signed)
Third Trimester of Pregnancy The third trimester is from week 28 through week 40 (months 7 through 9). The third trimester is a time when the unborn baby (fetus) is growing rapidly. At the end of the ninth month, the fetus is about 20 inches in length and weighs 6-10 pounds. Body changes during your third trimester Your body will continue to go through many changes during pregnancy. The changes vary from woman to woman. During the third trimester:  Your weight will continue to increase. You can expect to gain 25-35 pounds (11-16 kg) by the end of the pregnancy.  You may begin to get stretch marks on your hips, abdomen, and breasts.  You may urinate more often because the fetus is moving lower into your pelvis and pressing on your bladder.  You may develop or continue to have heartburn. This is caused by increased hormones that slow down muscles in the digestive tract.  You may develop or continue to have constipation because increased hormones slow digestion and cause the muscles that push waste through your intestines to relax.  You may develop hemorrhoids. These are swollen veins (varicose veins) in the rectum that can itch or be painful.  You may develop swollen, bulging veins (varicose veins) in your legs.  You may have increased body aches in the pelvis, back, or thighs. This is due to weight gain and increased hormones that are relaxing your joints.  You may have changes in your hair. These can include thickening of your hair, rapid growth, and changes in texture. Some women also have hair loss during or after pregnancy, or hair that feels dry or thin. Your hair will most likely return to normal after your baby is born.  Your breasts will continue to grow and they will continue to become tender. A yellow fluid (colostrum) may leak from your breasts. This is the first milk you are producing for your baby.  Your belly button may stick out.  You may notice more swelling in your hands,  face, or ankles.  You may have increased tingling or numbness in your hands, arms, and legs. The skin on your belly may also feel numb.  You may feel short of breath because of your expanding uterus.  You may have more problems sleeping. This can be caused by the size of your belly, increased need to urinate, and an increase in your body's metabolism.  You may notice the fetus "dropping," or moving lower in your abdomen (lightening).  You may have increased vaginal discharge.  You may notice your joints feel loose and you may have pain around your pelvic bone.  What to expect at prenatal visits You will have prenatal exams every 2 weeks until week 36. Then you will have weekly prenatal exams. During a routine prenatal visit:  You will be weighed to make sure you and the baby are growing normally.  Your blood pressure will be taken.  Your abdomen will be measured to track your baby's growth.  The fetal heartbeat will be listened to.  Any test results from the previous visit will be discussed.  You may have a cervical check near your due date to see if your cervix has softened or thinned (effaced).  You will be tested for Group B streptococcus. This happens between 35 and 37 weeks.  Your health care provider may ask you:  What your birth plan is.  How you are feeling.  If you are feeling the baby move.  If you have had   any abnormal symptoms, such as leaking fluid, bleeding, severe headaches, or abdominal cramping.  If you are using any tobacco products, including cigarettes, chewing tobacco, and electronic cigarettes.  If you have any questions.  Other tests or screenings that may be performed during your third trimester include:  Blood tests that check for low iron levels (anemia).  Fetal testing to check the health, activity level, and growth of the fetus. Testing is done if you have certain medical conditions or if there are problems during the  pregnancy.  Nonstress test (NST). This test checks the health of your baby to make sure there are no signs of problems, such as the baby not getting enough oxygen. During this test, a belt is placed around your belly. The baby is made to move, and its heart rate is monitored during movement.  What is false labor? False labor is a condition in which you feel small, irregular tightenings of the muscles in the womb (contractions) that usually go away with rest, changing position, or drinking water. These are called Braxton Hicks contractions. Contractions may last for hours, days, or even weeks before true labor sets in. If contractions come at regular intervals, become more frequent, increase in intensity, or become painful, you should see your health care provider. What are the signs of labor?  Abdominal cramps.  Regular contractions that start at 10 minutes apart and become stronger and more frequent with time.  Contractions that start on the top of the uterus and spread down to the lower abdomen and back.  Increased pelvic pressure and dull back pain.  A watery or bloody mucus discharge that comes from the vagina.  Leaking of amniotic fluid. This is also known as your "water breaking." It could be a slow trickle or a gush. Let your health care provider know if it has a color or strange odor. If you have any of these signs, call your health care provider right away, even if it is before your due date. Follow these instructions at home: Medicines  Follow your health care provider's instructions regarding medicine use. Specific medicines may be either safe or unsafe to take during pregnancy.  Take a prenatal vitamin that contains at least 600 micrograms (mcg) of folic acid.  If you develop constipation, try taking a stool softener if your health care provider approves. Eating and drinking  Eat a balanced diet that includes fresh fruits and vegetables, whole grains, good sources of protein  such as meat, eggs, or tofu, and low-fat dairy. Your health care provider will help you determine the amount of weight gain that is right for you.  Avoid raw meat and uncooked cheese. These carry germs that can cause birth defects in the baby.  If you have low calcium intake from food, talk to your health care provider about whether you should take a daily calcium supplement.  Eat four or five small meals rather than three large meals a day.  Limit foods that are high in fat and processed sugars, such as fried and sweet foods.  To prevent constipation: ? Drink enough fluid to keep your urine clear or pale yellow. ? Eat foods that are high in fiber, such as fresh fruits and vegetables, whole grains, and beans. Activity  Exercise only as directed by your health care provider. Most women can continue their usual exercise routine during pregnancy. Try to exercise for 30 minutes at least 5 days a week. Stop exercising if you experience uterine contractions.  Avoid heavy   lifting.  Do not exercise in extreme heat or humidity, or at high altitudes.  Wear low-heel, comfortable shoes.  Practice good posture.  You may continue to have sex unless your health care provider tells you otherwise. Relieving pain and discomfort  Take frequent breaks and rest with your legs elevated if you have leg cramps or low back pain.  Take warm sitz baths to soothe any pain or discomfort caused by hemorrhoids. Use hemorrhoid cream if your health care provider approves.  Wear a good support bra to prevent discomfort from breast tenderness.  If you develop varicose veins: ? Wear support pantyhose or compression stockings as told by your healthcare provider. ? Elevate your feet for 15 minutes, 3-4 times a day. Prenatal care  Write down your questions. Take them to your prenatal visits.  Keep all your prenatal visits as told by your health care provider. This is important. Safety  Wear your seat belt at  all times when driving.  Make a list of emergency phone numbers, including numbers for family, friends, the hospital, and police and fire departments. General instructions  Avoid cat litter boxes and soil used by cats. These carry germs that can cause birth defects in the baby. If you have a cat, ask someone to clean the litter box for you.  Do not travel far distances unless it is absolutely necessary and only with the approval of your health care provider.  Do not use hot tubs, steam rooms, or saunas.  Do not drink alcohol.  Do not use any products that contain nicotine or tobacco, such as cigarettes and e-cigarettes. If you need help quitting, ask your health care provider.  Do not use any medicinal herbs or unprescribed drugs. These chemicals affect the formation and growth of the baby.  Do not douche or use tampons or scented sanitary pads.  Do not cross your legs for long periods of time.  To prepare for the arrival of your baby: ? Take prenatal classes to understand, practice, and ask questions about labor and delivery. ? Make a trial run to the hospital. ? Visit the hospital and tour the maternity area. ? Arrange for maternity or paternity leave through employers. ? Arrange for family and friends to take care of pets while you are in the hospital. ? Purchase a rear-facing car seat and make sure you know how to install it in your car. ? Pack your hospital bag. ? Prepare the baby's nursery. Make sure to remove all pillows and stuffed animals from the baby's crib to prevent suffocation.  Visit your dentist if you have not gone during your pregnancy. Use a soft toothbrush to brush your teeth and be gentle when you floss. Contact a health care provider if:  You are unsure if you are in labor or if your water has broken.  You become dizzy.  You have mild pelvic cramps, pelvic pressure, or nagging pain in your abdominal area.  You have lower back pain.  You have persistent  nausea, vomiting, or diarrhea.  You have an unusual or bad smelling vaginal discharge.  You have pain when you urinate. Get help right away if:  Your water breaks before 37 weeks.  You have regular contractions less than 5 minutes apart before 37 weeks.  You have a fever.  You are leaking fluid from your vagina.  You have spotting or bleeding from your vagina.  You have severe abdominal pain or cramping.  You have rapid weight loss or weight gain.    You have shortness of breath with chest pain.  You notice sudden or extreme swelling of your face, hands, ankles, feet, or legs.  Your baby makes fewer than 10 movements in 2 hours.  You have severe headaches that do not go away when you take medicine.  You have vision changes. Summary  The third trimester is from week 28 through week 40, months 7 through 9. The third trimester is a time when the unborn baby (fetus) is growing rapidly.  During the third trimester, your discomfort may increase as you and your baby continue to gain weight. You may have abdominal, leg, and back pain, sleeping problems, and an increased need to urinate.  During the third trimester your breasts will keep growing and they will continue to become tender. A yellow fluid (colostrum) may leak from your breasts. This is the first milk you are producing for your baby.  False labor is a condition in which you feel small, irregular tightenings of the muscles in the womb (contractions) that eventually go away. These are called Braxton Hicks contractions. Contractions may last for hours, days, or even weeks before true labor sets in.  Signs of labor can include: abdominal cramps; regular contractions that start at 10 minutes apart and become stronger and more frequent with time; watery or bloody mucus discharge that comes from the vagina; increased pelvic pressure and dull back pain; and leaking of amniotic fluid. This information is not intended to replace advice  given to you by your health care provider. Make sure you discuss any questions you have with your health care provider. Document Released: 12/22/2000 Document Revised: 06/05/2015 Document Reviewed: 02/29/2012 Elsevier Interactive Patient Education  2017 [employer]  

## 2016-07-16 NOTE — Progress Notes (Signed)
   PRENATAL VISIT NOTE  Subjective:  Renee Orozco is a 31 y.o. G1P0 at 598w2d being seen today for ongoing prenatal care.  She is currently monitored for the following issues for this low-risk pregnancy and has Normal pregnancy; History of cervical LEEP biopsy affecting care of mother, antepartum; and Rubella non-immune status, antepartum on her problem list.  Patient reports no complaints.  Contractions: Not present. Vag. Bleeding: None.  Movement: Present. Denies leaking of fluid.   The following portions of the patient's history were reviewed and updated as appropriate: allergies, current medications, past family history, past medical history, past social history, past surgical history and problem list. Problem list updated.  Objective:   Vitals:   07/16/16 0901  BP: 113/78  Pulse: 85  Weight: 204 lb (92.5 kg)    Fetal Status: Fetal Heart Rate (bpm): 138   Movement: Present     General:  Alert, oriented and cooperative. Patient is in no acute distress.  Skin: Skin is warm and dry. No rash noted.   Cardiovascular: Normal heart rate noted  Respiratory: Normal respiratory effort, no problems with respiration noted  Abdomen: Soft, gravid, appropriate for gestational age. Pain/Pressure: Present     Pelvic:  Cervical exam deferred      Fundal Height 36cm  Extremities: Normal range of motion.  Edema: None  Mental Status: Normal mood and affect. Normal behavior. Normal judgment and thought content.   Assessment and Plan:  Pregnancy: G1P0 at 648w2d  1. Encounter for supervision of normal first pregnancy in third trimester Routine FU GBS at next visit  Preterm labor symptoms and general obstetric precautions including but not limited to vaginal bleeding, contractions, leaking of fluid and fetal movement were reviewed in detail with the patient. Please refer to After Visit Summary for other counseling recommendations.  Return in about 2 weeks (around 07/30/2016).   Thressa ShellerHeather Hogan,  CNM

## 2016-07-23 ENCOUNTER — Ambulatory Visit (INDEPENDENT_AMBULATORY_CARE_PROVIDER_SITE_OTHER): Payer: Managed Care, Other (non HMO) | Admitting: Advanced Practice Midwife

## 2016-07-23 VITALS — BP 102/73 | HR 94 | Wt 204.0 lb

## 2016-07-23 DIAGNOSIS — Z3403 Encounter for supervision of normal first pregnancy, third trimester: Secondary | ICD-10-CM

## 2016-07-23 DIAGNOSIS — Z113 Encounter for screening for infections with a predominantly sexual mode of transmission: Secondary | ICD-10-CM

## 2016-07-23 DIAGNOSIS — Z3493 Encounter for supervision of normal pregnancy, unspecified, third trimester: Secondary | ICD-10-CM

## 2016-07-23 NOTE — Patient Instructions (Signed)
Breastfeeding Deciding to breastfeed is one of the best choices you can make for you and your baby. A change in hormones during pregnancy causes your breast tissue to grow and increases the number and size of your milk ducts. These hormones also allow proteins, sugars, and fats from your blood supply to make breast milk in your milk-producing glands. Hormones prevent breast milk from being released before your baby is born as well as prompt milk flow after birth. Once breastfeeding has begun, thoughts of your baby, as well as his or her sucking or crying, can stimulate the release of milk from your milk-producing glands. Benefits of breastfeeding For Your Baby  Your first milk (colostrum) helps your baby's digestive system function better.  There are antibodies in your milk that help your baby fight off infections.  Your baby has a lower incidence of asthma, allergies, and sudden infant death syndrome.  The nutrients in breast milk are better for your baby than infant formulas and are designed uniquely for your baby's needs.  Breast milk improves your baby's brain development.  Your baby is less likely to develop other conditions, such as childhood obesity, asthma, or type 2 diabetes mellitus.  For You  Breastfeeding helps to create a very special bond between you and your baby.  Breastfeeding is convenient. Breast milk is always available at the correct temperature and costs nothing.  Breastfeeding helps to burn calories and helps you lose the weight gained during pregnancy.  Breastfeeding makes your uterus contract to its prepregnancy size faster and slows bleeding (lochia) after you give birth.  Breastfeeding helps to lower your risk of developing type 2 diabetes mellitus, osteoporosis, and breast or ovarian cancer later in life.  Signs that your baby is hungry Early Signs of Hunger  Increased alertness or activity.  Stretching.  Movement of the head from side to  side.  Movement of the head and opening of the mouth when the corner of the mouth or cheek is stroked (rooting).  Increased sucking sounds, smacking lips, cooing, sighing, or squeaking.  Hand-to-mouth movements.  Increased sucking of fingers or hands.  Late Signs of Hunger  Fussing.  Intermittent crying.  Extreme Signs of Hunger Signs of extreme hunger will require calming and consoling before your baby will be able to breastfeed successfully. Do not wait for the following signs of extreme hunger to occur before you initiate breastfeeding:  Restlessness.  A loud, strong cry.  Screaming.  Breastfeeding basics Breastfeeding Initiation  Find a comfortable place to sit or lie down, with your neck and back well supported.  Place a pillow or rolled up blanket under your baby to bring him or her to the level of your breast (if you are seated). Nursing pillows are specially designed to help support your arms and your baby while you breastfeed.  Make sure that your baby's abdomen is facing your abdomen.  Gently massage your breast. With your fingertips, massage from your chest wall toward your nipple in a circular motion. This encourages milk flow. You may need to continue this action during the feeding if your milk flows slowly.  Support your breast with 4 fingers underneath and your thumb above your nipple. Make sure your fingers are well away from your nipple and your baby's mouth.  Stroke your baby's lips gently with your finger or nipple.  When your baby's mouth is open wide enough, quickly bring your baby to your breast, placing your entire nipple and as much of the colored area   around your nipple (areola) as possible into your baby's mouth. ? More areola should be visible above your baby's upper lip than below the lower lip. ? Your baby's tongue should be between his or her lower gum and your breast.  Ensure that your baby's mouth is correctly positioned around your nipple  (latched). Your baby's lips should create a seal on your breast and be turned out (everted).  It is common for your baby to suck about 2-3 minutes in order to start the flow of breast milk.  Latching Teaching your baby how to latch on to your breast properly is very important. An improper latch can cause nipple pain and decreased milk supply for you and poor weight gain in your baby. Also, if your baby is not latched onto your nipple properly, he or she may swallow some air during feeding. This can make your baby fussy. Burping your baby when you switch breasts during the feeding can help to get rid of the air. However, teaching your baby to latch on properly is still the best way to prevent fussiness from swallowing air while breastfeeding. Signs that your baby has successfully latched on to your nipple:  Silent tugging or silent sucking, without causing you pain.  Swallowing heard between every 3-4 sucks.  Muscle movement above and in front of his or her ears while sucking.  Signs that your baby has not successfully latched on to nipple:  Sucking sounds or smacking sounds from your baby while breastfeeding.  Nipple pain.  If you think your baby has not latched on correctly, slip your finger into the corner of your baby's mouth to break the suction and place it between your baby's gums. Attempt breastfeeding initiation again. Signs of Successful Breastfeeding Signs from your baby:  A gradual decrease in the number of sucks or complete cessation of sucking.  Falling asleep.  Relaxation of his or her body.  Retention of a small amount of milk in his or her mouth.  Letting go of your breast by himself or herself.  Signs from you:  Breasts that have increased in firmness, weight, and size 1-3 hours after feeding.  Breasts that are softer immediately after breastfeeding.  Increased milk volume, as well as a change in milk consistency and color by the fifth day of  breastfeeding.  Nipples that are not sore, cracked, or bleeding.  Signs That Your Baby is Getting Enough Milk  Wetting at least 1-2 diapers during the first 24 hours after birth.  Wetting at least 5-6 diapers every 24 hours for the first week after birth. The urine should be clear or pale yellow by 5 days after birth.  Wetting 6-8 diapers every 24 hours as your baby continues to grow and develop.  At least 3 stools in a 24-hour period by age 5 days. The stool should be soft and yellow.  At least 3 stools in a 24-hour period by age 7 days. The stool should be seedy and yellow.  No loss of weight greater than 10% of birth weight during the first 3 days of age.  Average weight gain of 4-7 ounces (113-198 g) per week after age 4 days.  Consistent daily weight gain by age 5 days, without weight loss after the age of 2 weeks.  After a feeding, your baby may spit up a small amount. This is common. Breastfeeding frequency and duration Frequent feeding will help you make more milk and can prevent sore nipples and breast engorgement. Breastfeed when   you feel the need to reduce the fullness of your breasts or when your baby shows signs of hunger. This is called "breastfeeding on demand." Avoid introducing a pacifier to your baby while you are working to establish breastfeeding (the first 4-6 weeks after your baby is born). After this time you may choose to use a pacifier. Research has shown that pacifier use during the first year of a baby's life decreases the risk of sudden infant death syndrome (SIDS). Allow your baby to feed on each breast as long as he or she wants. Breastfeed until your baby is finished feeding. When your baby unlatches or falls asleep while feeding from the first breast, offer the second breast. Because newborns are often sleepy in the first few weeks of life, you may need to awaken your baby to get him or her to feed. Breastfeeding times will vary from baby to baby. However,  the following rules can serve as a guide to help you ensure that your baby is properly fed:  Newborns (babies 4 weeks of age or younger) may breastfeed every 1-3 hours.  Newborns should not go longer than 3 hours during the day or 5 hours during the night without breastfeeding.  You should breastfeed your baby a minimum of 8 times in a 24-hour period until you begin to introduce solid foods to your baby at around 6 months of age.  Breast milk pumping Pumping and storing breast milk allows you to ensure that your baby is exclusively fed your breast milk, even at times when you are unable to breastfeed. This is especially important if you are going back to work while you are still breastfeeding or when you are not able to be present during feedings. Your lactation consultant can give you guidelines on how long it is safe to store breast milk. A breast pump is a machine that allows you to pump milk from your breast into a sterile bottle. The pumped breast milk can then be stored in a refrigerator or freezer. Some breast pumps are operated by hand, while others use electricity. Ask your lactation consultant which type will work best for you. Breast pumps can be purchased, but some hospitals and breastfeeding support groups lease breast pumps on a monthly basis. A lactation consultant can teach you how to hand express breast milk, if you prefer not to use a pump. Caring for your breasts while you breastfeed Nipples can become dry, cracked, and sore while breastfeeding. The following recommendations can help keep your breasts moisturized and healthy:  Avoid using soap on your nipples.  Wear a supportive bra. Although not required, special nursing bras and tank tops are designed to allow access to your breasts for breastfeeding without taking off your entire bra or top. Avoid wearing underwire-style bras or extremely tight bras.  Air dry your nipples for 3-4minutes after each feeding.  Use only cotton  bra pads to absorb leaked breast milk. Leaking of breast milk between feedings is normal.  Use lanolin on your nipples after breastfeeding. Lanolin helps to maintain your skin's normal moisture barrier. If you use pure lanolin, you do not need to wash it off before feeding your baby again. Pure lanolin is not toxic to your baby. You may also hand express a few drops of breast milk and gently massage that milk into your nipples and allow the milk to air dry.  In the first few weeks after giving birth, some women experience extremely full breasts (engorgement). Engorgement can make your   breasts feel heavy, warm, and tender to the touch. Engorgement peaks within 3-5 days after you give birth. The following recommendations can help ease engorgement:  Completely empty your breasts while breastfeeding or pumping. You may want to start by applying warm, moist heat (in the shower or with warm water-soaked hand towels) just before feeding or pumping. This increases circulation and helps the milk flow. If your baby does not completely empty your breasts while breastfeeding, pump any extra milk after he or she is finished.  Wear a snug bra (nursing or regular) or tank top for 1-2 days to signal your body to slightly decrease milk production.  Apply ice packs to your breasts, unless this is too uncomfortable for you.  Make sure that your baby is latched on and positioned properly while breastfeeding.  If engorgement persists after 48 hours of following these recommendations, contact your health care provider or a lactation consultant. Overall health care recommendations while breastfeeding  Eat healthy foods. Alternate between meals and snacks, eating 3 of each per day. Because what you eat affects your breast milk, some of the foods may make your baby more irritable than usual. Avoid eating these foods if you are sure that they are negatively affecting your baby.  Drink milk, fruit juice, and water to  satisfy your thirst (about 10 glasses a day).  Rest often, relax, and continue to take your prenatal vitamins to prevent fatigue, stress, and anemia.  Continue breast self-awareness checks.  Avoid chewing and smoking tobacco. Chemicals from cigarettes that pass into breast milk and exposure to secondhand smoke may harm your baby.  Avoid alcohol and drug use, including marijuana. Some medicines that may be harmful to your baby can pass through breast milk. It is important to ask your health care provider before taking any medicine, including all over-the-counter and prescription medicine as well as vitamin and herbal supplements. It is possible to become pregnant while breastfeeding. If birth control is desired, ask your health care provider about options that will be safe for your baby. Contact a health care provider if:  You feel like you want to stop breastfeeding or have become frustrated with breastfeeding.  You have painful breasts or nipples.  Your nipples are cracked or bleeding.  Your breasts are red, tender, or warm.  You have a swollen area on either breast.  You have a fever or chills.  You have nausea or vomiting.  You have drainage other than breast milk from your nipples.  Your breasts do not become full before feedings by the fifth day after you give birth.  You feel sad and depressed.  Your baby is too sleepy to eat well.  Your baby is having trouble sleeping.  Your baby is wetting less than 3 diapers in a 24-hour period.  Your baby has less than 3 stools in a 24-hour period.  Your baby's skin or the white part of his or her eyes becomes yellow.  Your baby is not gaining weight by 5 days of age. Get help right away if:  Your baby is overly tired (lethargic) and does not want to wake up and feed.  Your baby develops an unexplained fever. This information is not intended to replace advice given to you by your health care provider. Make sure you discuss  any questions you have with your health care provider. Document Released: 12/28/2004 Document Revised: 06/11/2015 Document Reviewed: 06/21/2012 [employer] Interactive Patient Education  2017 [employer]  

## 2016-07-23 NOTE — Progress Notes (Signed)
   PRENATAL VISIT NOTE  Subjective:  Renee Orozco is a 31 y.o. G1P0 at 5418w2d being seen today for ongoing prenatal care.  She is currently monitored for the following issues for this low-risk pregnancy and has Normal pregnancy; History of cervical LEEP biopsy affecting care of mother, antepartum; and Rubella non-immune status, antepartum on her problem list.  Patient reports no complaints.  Contractions: Not present. Vag. Bleeding: None.  Movement: Present. Denies leaking of fluid.   The following portions of the patient's history were reviewed and updated as appropriate: allergies, current medications, past family history, past medical history, past social history, past surgical history and problem list. Problem list updated.  Objective:   Vitals:   07/23/16 0800  BP: 102/73  Pulse: 94  Weight: 204 lb (92.5 kg)    Fetal Status: Fetal Heart Rate (bpm): 123 Fundal Height: 36 cm Movement: Present  Presentation: Vertex  General:  Alert, oriented and cooperative. Patient is in no acute distress.  Skin: Skin is warm and dry. No rash noted.   Cardiovascular: Normal heart rate noted  Respiratory: Normal respiratory effort, no problems with respiration noted  Abdomen: Soft, gravid, appropriate for gestational age. Pain/Pressure: Present     Pelvic:  Cervical exam performed Dilation: 1.5 Effacement (%): 30 Station: Ballotable  Extremities: Normal range of motion.  Edema: None  Mental Status: Normal mood and affect. Normal behavior. Normal judgment and thought content.   Assessment and Plan:  Pregnancy: G1P0 at 2218w2d  1. Normal pregnancy in third trimester  - Culture, beta strep (group b only) - Urine cytology ancillary only  Term labor symptoms and general obstetric precautions including but not limited to vaginal bleeding, contractions, leaking of fluid and fetal movement were reviewed in detail with the patient. Please refer to After Visit Summary for other counseling  recommendations.  Return in 1 week (on 07/30/2016).   Dorathy KinsmanVirginia Porfirio Bollier, CNM

## 2016-07-26 LAB — URINE CYTOLOGY ANCILLARY ONLY
CHLAMYDIA, DNA PROBE: NEGATIVE
NEISSERIA GONORRHEA: NEGATIVE

## 2016-07-26 LAB — CULTURE, BETA STREP (GROUP B ONLY)

## 2016-07-29 ENCOUNTER — Encounter: Payer: Self-pay | Admitting: *Deleted

## 2016-07-29 ENCOUNTER — Ambulatory Visit (INDEPENDENT_AMBULATORY_CARE_PROVIDER_SITE_OTHER): Payer: Managed Care, Other (non HMO) | Admitting: Obstetrics & Gynecology

## 2016-07-29 VITALS — BP 123/70 | HR 87 | Wt 206.0 lb

## 2016-07-29 DIAGNOSIS — Z3483 Encounter for supervision of other normal pregnancy, third trimester: Secondary | ICD-10-CM

## 2016-07-29 DIAGNOSIS — Z3493 Encounter for supervision of normal pregnancy, unspecified, third trimester: Secondary | ICD-10-CM

## 2016-07-29 NOTE — Patient Instructions (Signed)
Vaginal Delivery Vaginal delivery means that you will give birth by pushing your baby out of your birth canal (vagina). A team of health care providers will help you before, during, and after vaginal delivery. Birth experiences are unique for every woman and every pregnancy, and birth experiences vary depending on where you choose to give birth. What should I do to prepare for my baby's birth? Before your baby is born, it is important to talk with your health care provider about:  Your labor and delivery preferences. These may include: ? Medicines that you may be given. ? How you will manage your pain. This might include non-medical pain relief techniques or injectable pain relief such as epidural analgesia. ? How you and your baby will be monitored during labor and delivery. ? Who may be in the labor and delivery room with you. ? Your feelings about surgical delivery of your baby (cesarean delivery, or C-section) if this becomes necessary. ? Your feelings about receiving donated blood through an IV tube (blood transfusion) if this becomes necessary.  Whether you are able: ? To take pictures or videos of the birth. ? To eat during labor and delivery. ? To move around, walk, or change positions during labor and delivery.  What to expect after your baby is born, such as: ? Whether delayed umbilical cord clamping and cutting is offered. ? Who will care for your baby right after birth. ? Medicines or tests that may be recommended for your baby. ? Whether breastfeeding is supported in your hospital or birth center. ? How long you will be in the hospital or birth center.  How any medical conditions you have may affect your baby or your labor and delivery experience.  To prepare for your baby's birth, you should also:  Attend all of your health care visits before delivery (prenatal visits) as recommended by your health care provider. This is important.  Prepare your home for your baby's  arrival. Make sure that you have: ? Diapers. ? Baby clothing. ? Feeding equipment. ? Safe sleeping arrangements for you and your baby.  Install a car seat in your vehicle. Have your car seat checked by a certified car seat installer to make sure that it is installed safely.  Think about who will help you with your new baby at home for at least the first several weeks after delivery.  What can I expect when I arrive at the birth center or hospital? Once you are in labor and have been admitted into the hospital or birth center, your health care provider may:  Review your pregnancy history and any concerns you have.  Insert an IV tube into one of your veins. This is used to give you fluids and medicines.  Check your blood pressure, pulse, temperature, and heart rate (vital signs).  Check whether your bag of water (amniotic sac) has broken (ruptured).  Talk with you about your birth plan and discuss pain control options.  Monitoring Your health care provider may monitor your contractions (uterine monitoring) and your baby's heart rate (fetal monitoring). You may need to be monitored:  Often, but not continuously (intermittently).  All the time or for long periods at a time (continuously). Continuous monitoring may be needed if: ? You are taking certain medicines, such as medicine to relieve pain or make your contractions stronger. ? You have pregnancy or labor complications.  Monitoring may be done by:  Placing a special stethoscope or a handheld monitoring device on your abdomen to   check your baby's heartbeat, and feeling your abdomen for contractions. This method of monitoring does not continuously record your baby's heartbeat or your contractions.  Placing monitors on your abdomen (external monitors) to record your baby's heartbeat and the frequency and length of contractions. You may not have to wear external monitors all the time.  Placing monitors inside of your uterus  (internal monitors) to record your baby's heartbeat and the frequency, length, and strength of your contractions. ? Your health care provider may use internal monitors if he or she needs more information about the strength of your contractions or your baby's heart rate. ? Internal monitors are put in place by passing a thin, flexible wire through your vagina and into your uterus. Depending on the type of monitor, it may remain in your uterus or on your baby's head until birth. ? Your health care provider will discuss the benefits and risks of internal monitoring with you and will ask for your permission before inserting the monitors.  Telemetry. This is a type of continuous monitoring that can be done with external or internal monitors. Instead of having to stay in bed, you are able to move around during telemetry. Ask your health care provider if telemetry is an option for you.  Physical exam Your health care provider may perform a physical exam. This may include:  Checking whether your baby is positioned: ? With the head toward your vagina (head-down). This is most common. ? With the head toward the top of your uterus (head-up or breech). If your baby is in a breech position, your health care provider may try to turn your baby to a head-down position so you can deliver vaginally. If it does not seem that your baby can be born vaginally, your provider may recommend surgery to deliver your baby. In rare cases, you may be able to deliver vaginally if your baby is head-up (breech delivery). ? Lying sideways (transverse). Babies that are lying sideways cannot be delivered vaginally.  Checking your cervix to determine: ? Whether it is thinning out (effacing). ? Whether it is opening up (dilating). ? How low your baby has moved into your birth canal.  What are the three stages of labor and delivery?  Normal labor and delivery is divided into the following three stages: Stage 1  Stage 1 is the  longest stage of labor, and it can last for hours or days. Stage 1 includes: ? Early labor. This is when contractions may be irregular, or regular and mild. Generally, early labor contractions are more than 10 minutes apart. ? Active labor. This is when contractions get longer, more regular, more frequent, and more intense. ? The transition phase. This is when contractions happen very close together, are very intense, and may last longer than during any other part of labor.  Contractions generally feel mild, infrequent, and irregular at first. They get stronger, more frequent (about every 2-3 minutes), and more regular as you progress from early labor through active labor and transition.  Many women progress through stage 1 naturally, but you may need help to continue making progress. If this happens, your health care provider may talk with you about: ? Rupturing your amniotic sac if it has not ruptured yet. ? Giving you medicine to help make your contractions stronger and more frequent.  Stage 1 ends when your cervix is completely dilated to 4 inches (10 cm) and completely effaced. This happens at the end of the transition phase. Stage 2  Once   your cervix is completely effaced and dilated to 4 inches (10 cm), you may start to feel an urge to push. It is common for the body to naturally take a rest before feeling the urge to push, especially if you received an epidural or certain other pain medicines. This rest period may last for up to 1-2 hours, depending on your unique labor experience.  During stage 2, contractions are generally less painful, because pushing helps relieve contraction pain. Instead of contraction pain, you may feel stretching and burning pain, especially when the widest part of your baby's head passes through the vaginal opening (crowning).  Your health care provider will closely monitor your pushing progress and your baby's progress through the vagina during stage 2.  Your  health care provider may massage the area of skin between your vaginal opening and anus (perineum) or apply warm compresses to your perineum. This helps it stretch as the baby's head starts to crown, which can help prevent perineal tearing. ? In some cases, an incision may be made in your perineum (episiotomy) to allow the baby to pass through the vaginal opening. An episiotomy helps to make the opening of the vagina larger to allow more room for the baby to fit through.  It is very important to breathe and focus so your health care provider can control the delivery of your baby's head. Your health care provider may have you decrease the intensity of your pushing, to help prevent perineal tearing.  After delivery of your baby's head, the shoulders and the rest of the body generally deliver very quickly and without difficulty.  Once your baby is delivered, the umbilical cord may be cut right away, or this may be delayed for 1-2 minutes, depending on your baby's health. This may vary among health care providers, hospitals, and birth centers.  If you and your baby are healthy enough, your baby may be placed on your chest or abdomen to help maintain the baby's temperature and to help you bond with each other. Some mothers and babies start breastfeeding at this time. Your health care team will dry your baby and help keep your baby warm during this time.  Your baby may need immediate care if he or she: ? Showed signs of distress during labor. ? Has a medical condition. ? Was born too early (prematurely). ? Had a bowel movement before birth (meconium). ? Shows signs of difficulty transitioning from being inside the uterus to being outside of the uterus. If you are planning to breastfeed, your health care team will help you begin a feeding. Stage 3  The third stage of labor starts immediately after the birth of your baby and ends after you deliver the placenta. The placenta is an organ that develops  during pregnancy to provide oxygen and nutrients to your baby in the womb.  Delivering the placenta may require some pushing, and you may have mild contractions. Breastfeeding can stimulate contractions to help you deliver the placenta.  After the placenta is delivered, your uterus should tighten (contract) and become firm. This helps to stop bleeding in your uterus. To help your uterus contract and to control bleeding, your health care provider may: ? Give you medicine by injection, through an IV tube, by mouth, or through your rectum (rectally). ? Massage your abdomen or perform a vaginal exam to remove any blood clots that are left in your uterus. ? Empty your bladder by placing a thin, flexible tube (catheter) into your bladder. ? Encourage   you to breastfeed your baby. After labor is over, you and your baby will be monitored closely to ensure that you are both healthy until you are ready to go home. Your health care team will teach you how to care for yourself and your baby. This information is not intended to replace advice given to you by your health care provider. Make sure you discuss any questions you have with your health care provider. Document Released: 10/07/2007 Document Revised: 07/18/2015 Document Reviewed: 01/12/2015 Elsevier Interactive Patient Education  2018 [employer]  

## 2016-07-29 NOTE — Progress Notes (Signed)
   PRENATAL VISIT NOTE  Subjective:  Renee SimmondsRebecca Orozco is a 31 y.o. G1P0 at 3147w1d being seen today for ongoing prenatal care.  She is currently monitored for the following issues for this low-risk pregnancy and has Normal pregnancy; History of cervical LEEP biopsy affecting care of mother, antepartum; and Rubella non-immune status, antepartum on her problem list.  Patient reports occasional contractions.  Contractions: Not present. Vag. Bleeding: None.  Movement: Present. Denies leaking of fluid.   The following portions of the patient's history were reviewed and updated as appropriate: allergies, current medications, past family history, past medical history, past social history, past surgical history and problem list. Problem list updated.  Objective:   Vitals:   07/29/16 1345  BP: 123/70  Pulse: 87  Weight: 206 lb (93.4 kg)    Fetal Status: Fetal Heart Rate (bpm): 136   Movement: Present     General:  Alert, oriented and cooperative. Patient is in no acute distress.  Skin: Skin is warm and dry. No rash noted.   Cardiovascular: Normal heart rate noted  Respiratory: Normal respiratory effort, no problems with respiration noted  Abdomen: Soft, gravid, appropriate for gestational age.  Pain/Pressure: Present     Pelvic: Cervical exam performed        Extremities: Normal range of motion.  Edema: None  Mental Status:  Normal mood and affect. Normal behavior. Normal judgment and thought content.   Assessment and Plan:  Pregnancy: G1P0 at 3147w1d  1. Normal pregnancy in third trimester   Term labor symptoms and general obstetric precautions including but not limited to vaginal bleeding, contractions, leaking of fluid and fetal movement were reviewed in detail with the patient. Please refer to After Visit Summary for other counseling recommendations.  Return in about 1 week (around 08/05/2016).   Scheryl DarterJames Arnold, MD

## 2016-08-06 ENCOUNTER — Ambulatory Visit (INDEPENDENT_AMBULATORY_CARE_PROVIDER_SITE_OTHER): Payer: Managed Care, Other (non HMO) | Admitting: Certified Nurse Midwife

## 2016-08-06 DIAGNOSIS — Z3493 Encounter for supervision of normal pregnancy, unspecified, third trimester: Secondary | ICD-10-CM

## 2016-08-06 NOTE — Patient Instructions (Signed)

## 2016-08-06 NOTE — Progress Notes (Signed)
Subjective:  Renee Orozco is a 31 y.o. G1P0 at 5236w2d being seen today for ongoing prenatal care.  She is currently monitored for the following issues for this low-risk pregnancy and has Normal pregnancy; History of cervical LEEP biopsy affecting care of mother, antepartum; and Rubella non-immune status, antepartum on her problem list.  Patient reports no complaints.  Contractions: Irritability. Vag. Bleeding: None.  Movement: Present. Denies leaking of fluid.   The following portions of the patient's history were reviewed and updated as appropriate: allergies, current medications, past family history, past medical history, past social history, past surgical history and problem list. Problem list updated.  Objective:   Vitals:   08/06/16 0916  BP: 114/76  Pulse: 79  Weight: 205 lb (93 kg)    Fetal Status: Fetal Heart Rate (bpm): 126 Fundal Height: 38 cm Movement: Present  Presentation: Vertex  General:  Alert, oriented and cooperative. Patient is in no acute distress.  Skin: Skin is warm and dry. No rash noted.   Cardiovascular: Normal heart rate noted  Respiratory: Normal respiratory effort, no problems with respiration noted  Abdomen: Soft, gravid, appropriate for gestational age. Pain/Pressure: Present     Pelvic: Vag. Bleeding: None Vag D/C Character: Thin   Cervical exam performed Dilation: 2 Effacement (%): 50 Station: -2  Extremities: Normal range of motion.  Edema: None  Mental Status: Normal mood and affect. Normal behavior. Normal judgment and thought content.   Urinalysis: Urine Protein: Negative Urine Glucose: Negative  Assessment and Plan:  Pregnancy: G1P0 at 2336w2d  1. Normal pregnancy in third trimester  Term labor symptoms and general obstetric precautions including but not limited to vaginal bleeding, contractions, leaking of fluid and fetal movement were reviewed in detail with the patient. Please refer to After Visit Summary for other counseling  recommendations.  Return in about 1 week (around 08/13/2016).   Donette LarryBhambri, Aunisty Reali, CNM

## 2016-08-13 ENCOUNTER — Encounter: Payer: Self-pay | Admitting: Advanced Practice Midwife

## 2016-08-13 ENCOUNTER — Ambulatory Visit (INDEPENDENT_AMBULATORY_CARE_PROVIDER_SITE_OTHER): Payer: Managed Care, Other (non HMO) | Admitting: Advanced Practice Midwife

## 2016-08-13 VITALS — BP 101/67 | HR 96 | Wt 206.0 lb

## 2016-08-13 DIAGNOSIS — Z3493 Encounter for supervision of normal pregnancy, unspecified, third trimester: Secondary | ICD-10-CM

## 2016-08-13 NOTE — Progress Notes (Signed)
   PRENATAL VISIT NOTE  Subjective:  Renee Orozco is a 31 y.o. G1P0 at 3730w2d being seen today for ongoing prenatal care.  She is currently monitored for the following issues for this low-risk pregnancy and has Normal pregnancy; History of cervical LEEP biopsy affecting care of mother, antepartum; and Rubella non-immune status, antepartum on her problem list.  Patient reports occasional contractions.  Contractions: Irritability. Vag. Bleeding: None.  Movement: Present. Denies leaking of fluid.   The following portions of the patient's history were reviewed and updated as appropriate: allergies, current medications, past family history, past medical history, past social history, past surgical history and problem list. Problem list updated.  Objective:   Vitals:   08/13/16 0815  BP: 101/67  Pulse: 96  Weight: 206 lb (93.4 kg)    Fetal Status: Fetal Heart Rate (bpm): 130   Movement: Present     General:  Alert, oriented and cooperative. Patient is in no acute distress.  Skin: Skin is warm and dry. No rash noted.   Cardiovascular: Normal heart rate noted  Respiratory: Normal respiratory effort, no problems with respiration noted  Abdomen: Soft, gravid, appropriate for gestational age.  Pain/Pressure: Present     Pelvic: Cervical exam performed        2+/60-70%/-2/vtx   Extremities: Normal range of motion.  Edema: None  Mental Status:  Normal mood and affect. Normal behavior. Normal judgment and thought content.   Assessment and Plan:  Pregnancy: G1P0 at 4430w2d  1. Normal pregnancy in third trimester      Discussed labor and birth       Membranes swept per request       Prefers CNM birth possible, discussed limitations, discussed if fellow attends her to just let them know what her preferences are        Prefers low intervention labor  Term labor symptoms and general obstetric precautions including but not limited to vaginal bleeding, contractions, leaking of fluid and fetal  movement were reviewed in detail with the patient. Please refer to After Visit Summary for other counseling recommendations.  Return in about 1 week (around 08/20/2016) [place] [facility].   Wynelle BourgeoisMarie Keaten Mashek, CNM

## 2016-08-13 NOTE — Patient Instructions (Signed)
Vaginal Delivery Vaginal delivery means that you will give birth by pushing your baby out of your birth canal (vagina). A team of health care providers will help you before, during, and after vaginal delivery. Birth experiences are unique for every woman and every pregnancy, and birth experiences vary depending on where you choose to give birth. What should I do to prepare for my baby's birth? Before your baby is born, it is important to talk with your health care provider about:  Your labor and delivery preferences. These may include: ? Medicines that you may be given. ? How you will manage your pain. This might include non-medical pain relief techniques or injectable pain relief such as epidural analgesia. ? How you and your baby will be monitored during labor and delivery. ? Who may be in the labor and delivery room with you. ? Your feelings about surgical delivery of your baby (cesarean delivery, or C-section) if this becomes necessary. ? Your feelings about receiving donated blood through an IV tube (blood transfusion) if this becomes necessary.  Whether you are able: ? To take pictures or videos of the birth. ? To eat during labor and delivery. ? To move around, walk, or change positions during labor and delivery.  What to expect after your baby is born, such as: ? Whether delayed umbilical cord clamping and cutting is offered. ? Who will care for your baby right after birth. ? Medicines or tests that may be recommended for your baby. ? Whether breastfeeding is supported in your hospital or birth center. ? How long you will be in the hospital or birth center.  How any medical conditions you have may affect your baby or your labor and delivery experience.  To prepare for your baby's birth, you should also:  Attend all of your health care visits before delivery (prenatal visits) as recommended by your health care provider. This is important.  Prepare your home for your baby's  arrival. Make sure that you have: ? Diapers. ? Baby clothing. ? Feeding equipment. ? Safe sleeping arrangements for you and your baby.  Install a car seat in your vehicle. Have your car seat checked by a certified car seat installer to make sure that it is installed safely.  Think about who will help you with your new baby at home for at least the first several weeks after delivery.  What can I expect when I arrive at the birth center or hospital? Once you are in labor and have been admitted into the hospital or birth center, your health care provider may:  Review your pregnancy history and any concerns you have.  Insert an IV tube into one of your veins. This is used to give you fluids and medicines.  Check your blood pressure, pulse, temperature, and heart rate (vital signs).  Check whether your bag of water (amniotic sac) has broken (ruptured).  Talk with you about your birth plan and discuss pain control options.  Monitoring Your health care provider may monitor your contractions (uterine monitoring) and your baby's heart rate (fetal monitoring). You may need to be monitored:  Often, but not continuously (intermittently).  All the time or for long periods at a time (continuously). Continuous monitoring may be needed if: ? You are taking certain medicines, such as medicine to relieve pain or make your contractions stronger. ? You have pregnancy or labor complications.  Monitoring may be done by:  Placing a special stethoscope or a handheld monitoring device on your abdomen to   check your baby's heartbeat, and feeling your abdomen for contractions. This method of monitoring does not continuously record your baby's heartbeat or your contractions.  Placing monitors on your abdomen (external monitors) to record your baby's heartbeat and the frequency and length of contractions. You may not have to wear external monitors all the time.  Placing monitors inside of your uterus  (internal monitors) to record your baby's heartbeat and the frequency, length, and strength of your contractions. ? Your health care provider may use internal monitors if he or she needs more information about the strength of your contractions or your baby's heart rate. ? Internal monitors are put in place by passing a thin, flexible wire through your vagina and into your uterus. Depending on the type of monitor, it may remain in your uterus or on your baby's head until birth. ? Your health care provider will discuss the benefits and risks of internal monitoring with you and will ask for your permission before inserting the monitors.  Telemetry. This is a type of continuous monitoring that can be done with external or internal monitors. Instead of having to stay in bed, you are able to move around during telemetry. Ask your health care provider if telemetry is an option for you.  Physical exam Your health care provider may perform a physical exam. This may include:  Checking whether your baby is positioned: ? With the head toward your vagina (head-down). This is most common. ? With the head toward the top of your uterus (head-up or breech). If your baby is in a breech position, your health care provider may try to turn your baby to a head-down position so you can deliver vaginally. If it does not seem that your baby can be born vaginally, your provider may recommend surgery to deliver your baby. In rare cases, you may be able to deliver vaginally if your baby is head-up (breech delivery). ? Lying sideways (transverse). Babies that are lying sideways cannot be delivered vaginally.  Checking your cervix to determine: ? Whether it is thinning out (effacing). ? Whether it is opening up (dilating). ? How low your baby has moved into your birth canal.  What are the three stages of labor and delivery?  Normal labor and delivery is divided into the following three stages: Stage 1  Stage 1 is the  longest stage of labor, and it can last for hours or days. Stage 1 includes: ? Early labor. This is when contractions may be irregular, or regular and mild. Generally, early labor contractions are more than 10 minutes apart. ? Active labor. This is when contractions get longer, more regular, more frequent, and more intense. ? The transition phase. This is when contractions happen very close together, are very intense, and may last longer than during any other part of labor.  Contractions generally feel mild, infrequent, and irregular at first. They get stronger, more frequent (about every 2-3 minutes), and more regular as you progress from early labor through active labor and transition.  Many women progress through stage 1 naturally, but you may need help to continue making progress. If this happens, your health care provider may talk with you about: ? Rupturing your amniotic sac if it has not ruptured yet. ? Giving you medicine to help make your contractions stronger and more frequent.  Stage 1 ends when your cervix is completely dilated to 4 inches (10 cm) and completely effaced. This happens at the end of the transition phase. Stage 2  Once   your cervix is completely effaced and dilated to 4 inches (10 cm), you may start to feel an urge to push. It is common for the body to naturally take a rest before feeling the urge to push, especially if you received an epidural or certain other pain medicines. This rest period may last for up to 1-2 hours, depending on your unique labor experience.  During stage 2, contractions are generally less painful, because pushing helps relieve contraction pain. Instead of contraction pain, you may feel stretching and burning pain, especially when the widest part of your baby's head passes through the vaginal opening (crowning).  Your health care provider will closely monitor your pushing progress and your baby's progress through the vagina during stage 2.  Your  health care provider may massage the area of skin between your vaginal opening and anus (perineum) or apply warm compresses to your perineum. This helps it stretch as the baby's head starts to crown, which can help prevent perineal tearing. ? In some cases, an incision may be made in your perineum (episiotomy) to allow the baby to pass through the vaginal opening. An episiotomy helps to make the opening of the vagina larger to allow more room for the baby to fit through.  It is very important to breathe and focus so your health care provider can control the delivery of your baby's head. Your health care provider may have you decrease the intensity of your pushing, to help prevent perineal tearing.  After delivery of your baby's head, the shoulders and the rest of the body generally deliver very quickly and without difficulty.  Once your baby is delivered, the umbilical cord may be cut right away, or this may be delayed for 1-2 minutes, depending on your baby's health. This may vary among health care providers, hospitals, and birth centers.  If you and your baby are healthy enough, your baby may be placed on your chest or abdomen to help maintain the baby's temperature and to help you bond with each other. Some mothers and babies start breastfeeding at this time. Your health care team will dry your baby and help keep your baby warm during this time.  Your baby may need immediate care if he or she: ? Showed signs of distress during labor. ? Has a medical condition. ? Was born too early (prematurely). ? Had a bowel movement before birth (meconium). ? Shows signs of difficulty transitioning from being inside the uterus to being outside of the uterus. If you are planning to breastfeed, your health care team will help you begin a feeding. Stage 3  The third stage of labor starts immediately after the birth of your baby and ends after you deliver the placenta. The placenta is an organ that develops  during pregnancy to provide oxygen and nutrients to your baby in the womb.  Delivering the placenta may require some pushing, and you may have mild contractions. Breastfeeding can stimulate contractions to help you deliver the placenta.  After the placenta is delivered, your uterus should tighten (contract) and become firm. This helps to stop bleeding in your uterus. To help your uterus contract and to control bleeding, your health care provider may: ? Give you medicine by injection, through an IV tube, by mouth, or through your rectum (rectally). ? Massage your abdomen or perform a vaginal exam to remove any blood clots that are left in your uterus. ? Empty your bladder by placing a thin, flexible tube (catheter) into your bladder. ? Encourage   you to breastfeed your baby. After labor is over, you and your baby will be monitored closely to ensure that you are both healthy until you are ready to go home. Your health care team will teach you how to care for yourself and your baby. This information is not intended to replace advice given to you by your health care provider. Make sure you discuss any questions you have with your health care provider. Document Released: 10/07/2007 Document Revised: 07/18/2015 Document Reviewed: 01/12/2015 Elsevier Interactive Patient Education  2018 [employer]  

## 2016-08-20 ENCOUNTER — Encounter: Payer: Managed Care, Other (non HMO) | Admitting: Obstetrics and Gynecology

## 2016-08-20 ENCOUNTER — Encounter (HOSPITAL_COMMUNITY): Payer: Self-pay

## 2016-08-20 ENCOUNTER — Inpatient Hospital Stay (HOSPITAL_COMMUNITY): Payer: Managed Care, Other (non HMO) | Admitting: Anesthesiology

## 2016-08-20 ENCOUNTER — Inpatient Hospital Stay (HOSPITAL_COMMUNITY)
Admission: AD | Admit: 2016-08-20 | Discharge: 2016-08-22 | DRG: 775 | Disposition: A | Discharge status: Home / Self Care | Payer: Managed Care, Other (non HMO) | Source: Ambulatory Visit | Attending: Family Medicine | Admitting: Family Medicine

## 2016-08-20 DIAGNOSIS — O99214 Obesity complicating childbirth: Secondary | ICD-10-CM | POA: Diagnosis not present

## 2016-08-20 DIAGNOSIS — O3440 Maternal care for other abnormalities of cervix, unspecified trimester: Secondary | ICD-10-CM

## 2016-08-20 DIAGNOSIS — E669 Obesity, unspecified: Secondary | ICD-10-CM | POA: Diagnosis present

## 2016-08-20 DIAGNOSIS — Z3493 Encounter for supervision of normal pregnancy, unspecified, third trimester: Secondary | ICD-10-CM | POA: Diagnosis present

## 2016-08-20 DIAGNOSIS — Z283 Underimmunization status: Secondary | ICD-10-CM

## 2016-08-20 DIAGNOSIS — Z6838 Body mass index (BMI) 38.0-38.9, adult: Secondary | ICD-10-CM | POA: Diagnosis not present

## 2016-08-20 DIAGNOSIS — Z3A40 40 weeks gestation of pregnancy: Secondary | ICD-10-CM

## 2016-08-20 DIAGNOSIS — Z9889 Other specified postprocedural states: Secondary | ICD-10-CM

## 2016-08-20 DIAGNOSIS — Z2839 Other underimmunization status: Secondary | ICD-10-CM

## 2016-08-20 DIAGNOSIS — O9989 Other specified diseases and conditions complicating pregnancy, childbirth and the puerperium: Secondary | ICD-10-CM

## 2016-08-20 DIAGNOSIS — O09899 Supervision of other high risk pregnancies, unspecified trimester: Secondary | ICD-10-CM

## 2016-08-20 LAB — RPR: RPR: NONREACTIVE

## 2016-08-20 LAB — CBC
HCT: 37.3 % (ref 36.0–46.0)
Hemoglobin: 12.9 g/dL (ref 12.0–15.0)
MCH: 29.7 pg (ref 26.0–34.0)
MCHC: 34.6 g/dL (ref 30.0–36.0)
MCV: 85.9 fL (ref 78.0–100.0)
PLATELETS: 220 10*3/uL (ref 150–400)
RBC: 4.34 MIL/uL (ref 3.87–5.11)
RDW: 14.1 % (ref 11.5–15.5)
WBC: 12.3 10*3/uL — ABNORMAL HIGH (ref 4.0–10.5)

## 2016-08-20 LAB — TYPE AND SCREEN
ABO/RH(D): O POS
Antibody Screen: NEGATIVE

## 2016-08-20 LAB — ABO/RH: ABO/RH(D): O POS

## 2016-08-20 MED ORDER — OXYCODONE-ACETAMINOPHEN 5-325 MG PO TABS
1.0000 | ORAL_TABLET | ORAL | Status: DC | PRN
Start: 2016-08-20 — End: 2016-08-21

## 2016-08-20 MED ORDER — EPHEDRINE 5 MG/ML INJ: 10.0000 mg | INTRAVENOUS | Status: DC | PRN

## 2016-08-20 MED ORDER — SOD CITRATE-CITRIC ACID 500-334 MG/5ML PO SOLN: 30.0000 mL | ORAL | Status: DC | PRN

## 2016-08-20 MED ORDER — ACETAMINOPHEN 325 MG PO TABS: 650.0000 mg | ORAL_TABLET | ORAL | Status: DC | PRN

## 2016-08-20 MED ORDER — PHENYLEPHRINE 40 MCG/ML (10ML) SYRINGE FOR IV PUSH (FOR BLOOD PRESSURE SUPPORT)
80.0000 ug | PREFILLED_SYRINGE | INTRAVENOUS | Status: DC | PRN
  Administered 2016-08-20 (×2): 80 ug via INTRAVENOUS

## 2016-08-20 MED ORDER — LACTATED RINGERS IV SOLN: 500.0000 mL | INTRAVENOUS | Status: DC | PRN

## 2016-08-20 MED ORDER — FENTANYL CITRATE (PF) 100 MCG/2ML IJ SOLN
INTRAMUSCULAR | Status: AC
  Filled 2016-08-20: qty 2

## 2016-08-20 MED ORDER — ONDANSETRON HCL 4 MG/2ML IJ SOLN: 4.0000 mg | Freq: Four times a day (QID) | INTRAMUSCULAR | Status: DC | PRN

## 2016-08-20 MED ORDER — DIPHENHYDRAMINE HCL 50 MG/ML IJ SOLN: 12.5000 mg | INTRAMUSCULAR | Status: DC | PRN

## 2016-08-20 MED ORDER — FENTANYL 2.5 MCG/ML BUPIVACAINE 1/10 % EPIDURAL INFUSION (WH - ANES)
14.0000 mL/h | INTRAMUSCULAR | Status: DC | PRN
  Administered 2016-08-20: 14 mL/h via EPIDURAL
  Administered 2016-08-20: 11.5 mL/h via EPIDURAL
  Filled 2016-08-20 (×2): qty 100

## 2016-08-20 MED ORDER — FENTANYL CITRATE (PF) 100 MCG/2ML IJ SOLN: 100.0000 ug | Freq: Once | INTRAMUSCULAR | Status: DC

## 2016-08-20 MED ORDER — OXYTOCIN 40 UNITS IN LACTATED RINGERS INFUSION - SIMPLE MED
1.0000 m[IU]/min | INTRAVENOUS | Status: DC
  Administered 2016-08-20: 2 m[IU]/min via INTRAVENOUS

## 2016-08-20 MED ORDER — LIDOCAINE HCL (PF) 1 % IJ SOLN
INTRAMUSCULAR | Status: DC | PRN
  Administered 2016-08-20 (×2): 4 mL via EPIDURAL

## 2016-08-20 MED ORDER — FENTANYL CITRATE (PF) 100 MCG/2ML IJ SOLN
50.0000 ug | INTRAMUSCULAR | Status: DC | PRN
  Administered 2016-08-20: 50 ug via INTRAVENOUS
  Administered 2016-08-20: 100 ug via INTRAVENOUS

## 2016-08-20 MED ORDER — TERBUTALINE SULFATE 1 MG/ML IJ SOLN: 0.2500 mg | Freq: Once | INTRAMUSCULAR | Status: DC | PRN

## 2016-08-20 MED ORDER — OXYTOCIN BOLUS FROM INFUSION
500.0000 mL | Freq: Once | INTRAVENOUS | Status: AC
  Administered 2016-08-20: 500 mL via INTRAVENOUS

## 2016-08-20 MED ORDER — LACTATED RINGERS IV SOLN
500.0000 mL | Freq: Once | INTRAVENOUS | Status: AC
  Administered 2016-08-20: 500 mL via INTRAVENOUS

## 2016-08-20 MED ORDER — LACTATED RINGERS IV SOLN
INTRAVENOUS | Status: DC
Start: 2016-08-20 — End: 2016-08-21
  Administered 2016-08-20 (×3): via INTRAVENOUS

## 2016-08-20 MED ORDER — PHENYLEPHRINE 40 MCG/ML (10ML) SYRINGE FOR IV PUSH (FOR BLOOD PRESSURE SUPPORT)
80.0000 ug | PREFILLED_SYRINGE | INTRAVENOUS | Status: DC | PRN
  Filled 2016-08-20: qty 10

## 2016-08-20 MED ORDER — FENTANYL CITRATE (PF) 100 MCG/2ML IJ SOLN: 50.0000 ug | INTRAMUSCULAR | Status: DC | PRN

## 2016-08-20 MED ORDER — LACTATED RINGERS IV BOLUS (SEPSIS)
1000.0000 mL | Freq: Once | INTRAVENOUS | Status: AC
  Administered 2016-08-20: 1000 mL via INTRAVENOUS

## 2016-08-20 MED ORDER — OXYTOCIN 40 UNITS IN LACTATED RINGERS INFUSION - SIMPLE MED
2.5000 [IU]/h | INTRAVENOUS | Status: DC
  Filled 2016-08-20: qty 1000

## 2016-08-20 MED ORDER — OXYCODONE-ACETAMINOPHEN 5-325 MG PO TABS: 2.0000 | ORAL_TABLET | ORAL | Status: DC | PRN

## 2016-08-20 MED ORDER — LIDOCAINE HCL (PF) 1 % IJ SOLN
30.0000 mL | INTRAMUSCULAR | Status: DC | PRN
  Administered 2016-08-20: 30 mL via SUBCUTANEOUS
  Filled 2016-08-20: qty 30

## 2016-08-20 NOTE — H&P (Signed)
OBSTETRIC ADMISSION HISTORY AND PHYSICAL  Renee Orozco is a 31 y.o. female G1P0 with IUP at 52w2dby [place] 9+3 presenting for SROM labor. She reports +FMs, No LOF, no VB, no blurry vision, headaches or peripheral edema, and RUQ pain.  She plans on breast feeding. She request mIUD for birth control. [facility]  Dating: By 9+3 [place]--->  Estimated Date of Delivery: 08/18/16  Sono:    19 weeks, CWD, normal anatomy   Prenatal History/Complications:  Past Medical History: Past Medical History:  Diagnosis Date  . Vaginal Pap smear, abnormal    Leep    Past Surgical History: Past Surgical History:  Procedure Laterality Date  . NO PAST SURGERIES      Obstetrical History: OB History    Gravida Para Term Preterm AB Living   1             SAB TAB Ectopic Multiple Live Births                  Social History: Social History   Social History  . Marital status: Married    Spouse name: N/A  . Number of children: N/A  . Years of education: N/A   Occupational History  . data analyst    Social History Main Topics  . Smoking status: Never Smoker  . Smokeless tobacco: Never Used  . Alcohol use Yes     Comment: occ wine  . Drug use: No  . Sexual activity: Yes    Partners: Male    Birth control/ protection: None   Other Topics Concern  . None   Social History Narrative  . None    Family History: Family History  Problem Relation Age of Onset  . Prostate cancer Father   . Diabetes Father   . Alzheimer's disease Maternal Grandmother   . Prostate cancer Maternal Grandfather   . Skin cancer Maternal Grandfather   . Deep vein thrombosis Paternal Grandfather   . Ovarian cancer Maternal Aunt   . Lung cancer Maternal Aunt     Allergies: No Known Allergies  Prescriptions Prior to Admission  Medication Sig Dispense Refill Last Dose  . Doxylamine-Pyridoxine (DICLEGIS) 10-10 MG TBEC Take two at bedtime, if no improvement add another  one in the morning.  If still no improvement then add another one in the afternoon. (Patient not taking: Reported on 07/29/2016) 100 tablet 1 Not Taking  . ondansetron (ZOFRAN ODT) 4 MG disintegrating tablet Take 1 tablet (4 mg total) by mouth every 8 (eight) hours as needed for nausea or vomiting. (Patient not taking: Reported on 07/29/2016) 30 tablet 0 Not Taking  . Prenatal Vit-Fe Fumarate-FA (PRENATAL VITAMIN PO) Take 1 tablet by mouth daily.    08/18/2016     Review of Systems   All systems reviewed and negative except as stated in HPI  Blood pressure 123/80, pulse 90, temperature (!) 97.3 F (36.3 C), temperature source Axillary, resp. rate 20, height 5' 1"  (1.549 m), weight 201 lb (91.2 kg), last menstrual period 11/12/2015. General appearance: alert, cooperative and moderate distress Lungs: normal WOB Heart: regular rate and rhythm Abdomen: soft, non-tender; bowel sounds normal Extremities: Homans sign is negative, no sign of DVT Presentation: cephalic Fetal monitoringBaseline: 115 bpm, Variability: Good {> 6 bpm), Accelerations: Reactive and Decelerations: Absent Uterine activityFrequency: Every 3 minutes Dilation: 5 Effacement (%): 90 Station: 0 Exam by:: katherine g jones RN    Prenatal labs: ABO, Rh: --/--/O  POS, O POS (08/10 0810) Antibody: NEG (08/10 0810) Rubella: <0.90 (01/05 0949) RPR: NON REAC (05/23 0827)  HBsAg: NEGATIVE (01/05 0949)  HIV: NONREACTIVE (05/23 0827)  GBS:  Negative GTT: Normal 82/104/92 Genetic screening  WNL Anatomy US WNL after FU  Prenatal Transfer Tool  Maternal Diabetes: No Genetic Screening: Normal Maternal Ultrasounds/Referrals: Normal Fetal Ultrasounds or other Referrals:  None Maternal Substance Abuse:  No Significant Maternal Medications:  None Significant Maternal Lab Results: Lab values include: Group B Strep negative  Results for orders placed or performed during the hospital encounter of 08/20/16 (from the past 24 hour(s))   CBC   Collection Time: 08/20/16  8:10 AM  Result Value Ref Range   WBC 12.3 (H) 4.0 - 10.5 K/uL   RBC 4.34 3.87 - 5.11 MIL/uL   Hemoglobin 12.9 12.0 - 15.0 g/dL   HCT 37.3 36.0 - 46.0 %   MCV 85.9 78.0 - 100.0 fL   MCH 29.7 26.0 - 34.0 pg   MCHC 34.6 30.0 - 36.0 g/dL   RDW 14.1 11.5 - 15.5 %   Platelets 220 150 - 400 K/uL  Type and screen [place]   Collection Time: 08/20/16  8:10 AM  Result Value Ref Range   ABO/RH(D) O POS    Antibody Screen NEG    Sample Expiration 08/23/2016   ABO/Rh   Collection Time: 08/20/16  8:10 AM  Result Value Ref Range   ABO/RH(D) O POS     Patient Active Problem List   Diagnosis Date Noted  . Normal labor 08/20/2016  . Rubella non-immune status, antepartum 02/20/2016  . Normal pregnancy 01/16/2016  . History of cervical LEEP biopsy affecting care of mother, antepartum 01/16/2016    Assessment/Plan:  Renee Orozco is a 31 y.o. G1P0 at 57w2dhere for SROM and Labor. Ruptured at home 8/10 around 5am. Contracting regularly since  #Labor:Plan to continue expectant mgmt. Consider augmentation with pitocin if needed #Pain: IV pain medication. Requests epidural #FWB: Cat 1  #ID:  GBS negative #MOF: Breast #MOC:mirena IUD #Circ:  Inpatient.   CWindell Moment MD  08/20/2016, 12:21 PM  CNM attestation:  I have seen and examined this patient; I agree with above documentation in the resident's note.   Renee Orozco a 31y.o. G1P0 here for SROM/latent labor  PE: BP 107/66 (BP Location: Left Arm)   Pulse (!) 57   Temp (!) 97.3 F (36.3 C) (Axillary)   Resp 20   Ht 5' 1"  (1.549 m)   Wt 91.2 kg (201 lb)   LMP 11/12/2015   BMI 37.98 kg/m   Resp: normal effort, no distress Abd: gravid  ROS, labs, PMH reviewed  [facility]: [facility] Anticipate SVD Offer MMR pp  SSerita GrammesCNM 08/20/2016, 3:44 PM

## 2016-08-20 NOTE — MAU Note (Signed)
Patient taken off monitor to ambulate in room, approved by Thressa ShellerHeather Hogan CNM

## 2016-08-20 NOTE — Anesthesia Preprocedure Evaluation (Signed)
Anesthesia Evaluation  Patient identified by MRN, date of birth, ID band Patient awake    Reviewed: Allergy & Precautions, H&P , Patient's Chart, lab work & pertinent test results  Airway Mallampati: II  TM Distance: >3 FB Neck ROM: full    Dental no notable dental hx. (+) Teeth Intact   Pulmonary neg pulmonary ROS,    Pulmonary exam normal breath sounds clear to auscultation       Cardiovascular negative cardio ROS Normal cardiovascular exam Rhythm:regular Rate:Normal     Neuro/Psych negative neurological ROS  negative psych ROS   GI/Hepatic Neg liver ROS, GERD  ,  Endo/Other  Obesity  Renal/GU negative Renal ROS  negative genitourinary   Musculoskeletal   Abdominal   Peds  Hematology negative hematology ROS (+)   Anesthesia Other Findings   Reproductive/Obstetrics (+) Pregnancy                             Anesthesia Physical Anesthesia Plan  ASA: II  Anesthesia Plan: Epidural   Post-op Pain Management:    Induction:   PONV Risk Score and Plan:   Airway Management Planned:   Additional Equipment:   Intra-op Plan:   Post-operative Plan:   Informed Consent: I have reviewed the patients History and Physical, chart, labs and discussed the procedure including the risks, benefits and alternatives for the proposed anesthesia with the patient or authorized representative who has indicated his/her understanding and acceptance.     Plan Discussed with: Anesthesiologist  Anesthesia Plan Comments:         Anesthesia Quick Evaluation

## 2016-08-20 NOTE — Progress Notes (Signed)
Patient ID: Renee SimmondsRebecca Orozco, female   DOB: [birth year], 31 y.o.   MRN: [id number]  Comfortable w/ epidural  VSS, afeb FHR 120s, +accels, some early variables Ctx q 3 mins, spont Cx ant lip/90/+1 by RN  IUP@term  Active labor/transition  Will check cx in 1-2 hrs or sooner prn  [name], [name] CNM 08/20/2016 9:22 PM

## 2016-08-20 NOTE — Progress Notes (Signed)
Labor Progress Note Renee Orozco is a 31 y.o. G1P0 at 8374w2d presented for SROM and Labor S: More comfortable with epidural  O:  BP (!) 88/54 (BP Location: Right Arm)   Pulse 76   Temp (!) 97.3 F (36.3 C) (Axillary)   Resp 20   Ht 5\' 1"  (1.549 m)   Wt 201 lb (91.2 kg)   LMP 11/12/2015   BMI 37.98 kg/m  EFM: 110/Moderate/+accel no decel  CVE: Dilation: 5 Effacement (%): 90 Cervical Position: Middle Station: 0 Presentation: Vertex Exam by:: katheirne g jones RN   A&P: 31 y.o. G1P0 5274w2d Admitted for SROM labor. Anticipate SVD #Labor: Progressing well. Forebag ruptured and FSE placed. Continue expectant management. #Pain: Epidural #FWB: Cat1 #GBS negative  John Giovanniorey P Kachina Niederer, MD 1:52 PM

## 2016-08-20 NOTE — Anesthesia Procedure Notes (Signed)
Epidural Patient location during procedure: OB Start time: 08/20/2016 12:23 PM  Staffing Anesthesiologist: Mal AmabileFOSTER, Xzavior Reinig Performed: anesthesiologist   Preanesthetic Checklist Completed: patient identified, site marked, surgical consent, pre-op evaluation, timeout performed, IV checked, risks and benefits discussed and monitors and equipment checked  Epidural Patient position: sitting Prep: site prepped and draped and DuraPrep Patient monitoring: continuous pulse ox and blood pressure Approach: midline Location: L3-L4 Injection technique: LOR air  Needle:  Needle type: Tuohy  Needle gauge: 17 G Needle length: 9 cm and 9 Needle insertion depth: 5 cm cm Catheter type: closed end flexible Catheter size: 19 Gauge Catheter at skin depth: 10 cm Test dose: negative and Other  Assessment Events: blood not aspirated, injection not painful, no injection resistance, negative IV test and no paresthesia  Additional Notes Patient identified. Risks and benefits discussed including failed block, incomplete  Pain control, post dural puncture headache, nerve damage, paralysis, blood pressure Changes, nausea, vomiting, reactions to medications-both toxic and allergic and post Partum back pain. All questions were answered. Patient expressed understanding and wished to proceed. Sterile technique was used throughout procedure. Epidural site was Dressed with sterile barrier dressing. No paresthesias, signs of intravascular injection Or signs of intrathecal spread were encountered.  Patient was more comfortable after the epidural was dosed. Please see RN's note for documentation of vital signs and FHR which are stable.

## 2016-08-20 NOTE — MAU Note (Signed)
Gush of clear fluid at 0530, pinkish now.  Contractions started at same time.  First preg, no problems with preg.  Was 3 cm last wk.

## 2016-08-20 NOTE — Anesthesia Pain Management Evaluation Note (Signed)
  CRNA Pain Management Visit Note  Patient: Renee SimmondsRebecca Callari, 31 y.o., female  "Hello I am a member of the anesthesia team at [facility]. We have an anesthesia team available at all times to provide care throughout the hospital, including epidural management and anesthesia for C-section. I don't know your plan for the delivery whether it a natural birth, water birth, IV sedation, nitrous supplementation, doula or epidural, but we want to meet your pain goals."   1.Was your pain managed to your expectations on prior hospitalizations?   No   2.What is your expectation for pain management during this hospitalization?     Epidural  3.How can we help you reach that goal? Epidural in place at time of visit  Record the patient's initial score and the patient's pain goal.   Pain: 2  Pain Goal: 10 [facility] wants you to be able to say your pain was always managed very well.  Rica RecordsICKELTON,Gracin Soohoo 08/20/2016

## 2016-08-21 ENCOUNTER — Encounter (HOSPITAL_COMMUNITY): Payer: Self-pay

## 2016-08-21 DIAGNOSIS — Z3A40 40 weeks gestation of pregnancy: Secondary | ICD-10-CM

## 2016-08-21 LAB — CBC
HEMATOCRIT: 28.1 % — AB (ref 36.0–46.0)
HEMOGLOBIN: 9.8 g/dL — AB (ref 12.0–15.0)
MCH: 30.2 pg (ref 26.0–34.0)
MCHC: 34.9 g/dL (ref 30.0–36.0)
MCV: 86.7 fL (ref 78.0–100.0)
Platelets: 190 10*3/uL (ref 150–400)
RBC: 3.24 MIL/uL — ABNORMAL LOW (ref 3.87–5.11)
RDW: 14.2 % (ref 11.5–15.5)
WBC: 17.6 10*3/uL — AB (ref 4.0–10.5)

## 2016-08-21 MED ORDER — OXYCODONE HCL 5 MG PO TABS: 5.0000 mg | ORAL_TABLET | ORAL | Status: DC | PRN

## 2016-08-21 MED ORDER — TETANUS-DIPHTH-ACELL PERTUSSIS 5-2.5-18.5 LF-MCG/0.5 IM SUSP: 0.5000 mL | Freq: Once | INTRAMUSCULAR | Status: DC

## 2016-08-21 MED ORDER — ONDANSETRON HCL 4 MG PO TABS: 4.0000 mg | ORAL_TABLET | ORAL | Status: DC | PRN

## 2016-08-21 MED ORDER — ONDANSETRON HCL 4 MG/2ML IJ SOLN: 4.0000 mg | INTRAMUSCULAR | Status: DC | PRN

## 2016-08-21 MED ORDER — DIBUCAINE 1 % RE OINT: 1.0000 "application " | TOPICAL_OINTMENT | RECTAL | Status: DC | PRN

## 2016-08-21 MED ORDER — SIMETHICONE 80 MG PO CHEW: 80.0000 mg | CHEWABLE_TABLET | ORAL | Status: DC | PRN

## 2016-08-21 MED ORDER — COCONUT OIL OIL
1.0000 | TOPICAL_OIL | Status: DC | PRN
Start: 2016-08-21 — End: 2016-08-22
  Administered 2016-08-22: 1 via TOPICAL
  Filled 2016-08-21: qty 120

## 2016-08-21 MED ORDER — IBUPROFEN 600 MG PO TABS
600.0000 mg | ORAL_TABLET | Freq: Four times a day (QID) | ORAL | Status: DC
  Administered 2016-08-21 – 2016-08-22 (×6): 600 mg via ORAL
  Filled 2016-08-21 (×6): qty 1

## 2016-08-21 MED ORDER — SENNOSIDES-DOCUSATE SODIUM 8.6-50 MG PO TABS
2.0000 | ORAL_TABLET | ORAL | Status: DC
  Administered 2016-08-21: 2 via ORAL
  Filled 2016-08-21: qty 2

## 2016-08-21 MED ORDER — DIPHENHYDRAMINE HCL 25 MG PO CAPS: 25.0000 mg | ORAL_CAPSULE | Freq: Four times a day (QID) | ORAL | Status: DC | PRN

## 2016-08-21 MED ORDER — PRENATAL MULTIVITAMIN CH
1.0000 | ORAL_TABLET | Freq: Every day | ORAL | Status: DC
  Administered 2016-08-21 – 2016-08-22 (×2): 1 via ORAL
  Filled 2016-08-21 (×2): qty 1

## 2016-08-21 MED ORDER — BENZOCAINE-MENTHOL 20-0.5 % EX AERO
1.0000 | INHALATION_SPRAY | CUTANEOUS | Status: DC | PRN
Start: 2016-08-21 — End: 2016-08-22
  Administered 2016-08-21: 1 via TOPICAL
  Filled 2016-08-21: qty 56

## 2016-08-21 MED ORDER — MEASLES, MUMPS & RUBELLA VAC ~~LOC~~ INJ
0.5000 mL | INJECTION | Freq: Once | SUBCUTANEOUS | Status: AC
  Administered 2016-08-22: 0.5 mL via SUBCUTANEOUS
  Filled 2016-08-21 (×2): qty 0.5

## 2016-08-21 MED ORDER — ACETAMINOPHEN 325 MG PO TABS
650.0000 mg | ORAL_TABLET | ORAL | Status: DC | PRN
  Administered 2016-08-21 – 2016-08-22 (×5): 650 mg via ORAL
  Filled 2016-08-21 (×5): qty 2

## 2016-08-21 MED ORDER — ZOLPIDEM TARTRATE 5 MG PO TABS: 5.0000 mg | ORAL_TABLET | Freq: Every evening | ORAL | Status: DC | PRN

## 2016-08-21 MED ORDER — WITCH HAZEL-GLYCERIN EX PADS: 1.0000 "application " | MEDICATED_PAD | CUTANEOUS | Status: DC | PRN

## 2016-08-21 NOTE — Progress Notes (Signed)
Patient given MMR VIS.

## 2016-08-21 NOTE — Lactation Note (Addendum)
This note was copied from a baby's chart. Lactation Consultation Note New mom has BF once. Baby sleeping. Mom has large breast w/short shaft nipples.. Hand expressed w/no colostrum noted. Mom had increased in breast during pregnancy.   Mom encouraged to feed baby 8-12 times/24 hours and with feeding cues. Educated newborn behavior, STS, I&O, cluster feeding, supply and demand.  Mom stated she would probably call for help when the next feeding. Discussed feeding positions.  WH/LC brochure given w/resources, support groups and LC services.  Patient Name: Renee Lourena SimmondsRebecca Fass WUJWJ'XToday's Date: 08/21/2016 Reason for consult: Initial assessment   Maternal Data Has patient been taught Hand Expression?: Yes Does the patient have breastfeeding experience prior to this delivery?: No  Feeding Feeding Type: Breast Fed Length of feed: 15 min  LATCH Score Latch: Repeated attempts needed to sustain latch, nipple held in mouth throughout feeding, stimulation needed to elicit sucking reflex.  Audible Swallowing: A few with stimulation  Type of Nipple: Everted at rest and after stimulation (short shaft)  Comfort (Breast/Nipple): Soft / non-tender  Hold (Positioning): Assistance needed to correctly position infant at breast and maintain latch.  LATCH Score: 7  Interventions Interventions: Breast feeding basics reviewed;Support pillows;Assisted with latch;Position options;Skin to skin;Breast massage;Hand express;Shells;Pre-pump if needed;Breast compression;Hand pump  Lactation Tools Discussed/Used Tools: Shells;Pump Shell Type: Inverted Breast pump type: Manual Pump Review: Setup, frequency, and cleaning;Milk Storage Initiated by:: Peri JeffersonL. Mckinsley Koelzer RN IBCLC Date initiated:: 08/21/16   Consult Status Consult Status: Follow-up Date: 08/21/16 Follow-up type: In-patient    Kameryn Davern, Diamond NickelLAURA G 08/21/2016, 5:59 AM

## 2016-08-21 NOTE — Progress Notes (Addendum)
Post Partum Day 1: delivered at 2345 08/20/2016 Subjective: no complaints, up ad lib and voiding; will attempt breakfast. Reports epidural is wearing off and feeling some pain her groin area. She wants to try scheduled NSAIDs for pain control.   Objective: Blood pressure (!) 102/59, pulse 69, temperature 98.4 F (36.9 C), temperature source Oral, resp. rate 18, height 5\' 1"  (1.549 m), weight 91.2 kg (201 lb), last menstrual period 11/12/2015, SpO2 98 %, unknown if currently breastfeeding.  Physical Exam:  General: alert, cooperative and no distress Lochia: appropriate Uterine Fundus: firm DVT Evaluation: No evidence of DVT seen on physical exam.   Recent Labs  08/20/16 0810 08/21/16 0510  HGB 12.9 9.8*  HCT 37.3 28.1*    Assessment/Plan: Plan for discharge tomorrow. Patient doing well 6-7 hours postpartum. Anticipate vaginal pain to worsen before it improves. Not asking narcotics at this time.    LOS: 1 day   Renee Orozco 08/21/2016, 8:46 AM   CNM attestation Post Partum Day #1 I have seen and examined this patient and agree with above documentation in the resident's note.   Renee Orozco is a 31 y.o. G1P1000 s/p SVD.  Pt denies problems with ambulating, voiding or po intake. Pain is well controlled.  Plan for birth control is IUD.  Method of Feeding: breast  PE:  BP (!) 102/59 (BP Location: Right Arm)   Pulse 69   Temp 98.4 F (36.9 C) (Oral)   Resp 18   Ht 5\' 1"  (1.549 m)   Wt 91.2 kg (201 lb)   LMP 11/12/2015   SpO2 98%   Breastfeeding? Unknown   BMI 37.98 kg/m  Fundus firm Perineum: healing well/approximated, no evidence of infection or hematoma; +edema  Plan for discharge: 08/22/16  [name], Jasiri Hanawalt, CNM 9:15 AM 08/21/2016

## 2016-08-21 NOTE — Anesthesia Postprocedure Evaluation (Signed)
Anesthesia Post Note  Patient: Renee SimmondsRebecca Archambeault  Procedure(s) Performed: * No procedures listed *     Patient location during evaluation: Mother Baby Anesthesia Type: Epidural Level of consciousness: awake Pain management: satisfactory to patient Vital Signs Assessment: post-procedure vital signs reviewed and stable Respiratory status: spontaneous breathing Cardiovascular status: stable Anesthetic complications: no    Last Vitals:  Vitals:   08/21/16 0230 08/21/16 0333  BP: 104/63 (!) 102/56  Pulse: (!) 105 81  Resp: 19 20  Temp: 36.7 C 36.9 C  SpO2: 98% 98%    Last Pain:  Vitals:   08/21/16 0334  TempSrc:   PainSc: 0-No pain   Pain Goal: Patients Stated Pain Goal: 3 (08/20/16 0925)               Cephus ShellingBURGER,Bear Osten

## 2016-08-21 NOTE — Lactation Note (Signed)
This note was copied from a baby's chart. Lactation Consultation Note Mom called for assistance in latching. Baby sleepy, has no interest in BF. Noted abd. Distended. Mom does have short shaft nipples. Strongly encouraged NS and pre-pump prior to latching.  Put baby STS, encouraged mom to wake baby every three hours if hasn't cued to BF. Baby voided.  Patient Name: Renee Lourena SimmondsRebecca Orozco ZOXWR'UToday's Date: 08/21/2016 Reason for consult: Follow-up assessment   Maternal Data Has patient been taught Hand Expression?: Yes Does the patient have breastfeeding experience prior to this delivery?: No  Feeding    LATCH Score Latch: Too sleepy or reluctant, no latch achieved, no sucking elicited.     Type of Nipple: Everted at rest and after stimulation (short shaft)  Comfort (Breast/Nipple): Soft / non-tender        Interventions Interventions: Breast feeding basics reviewed;Support pillows;Assisted with latch;Position options;Skin to skin;Breast massage;Hand express;Shells;Pre-pump if needed;Breast compression;Hand pump  Lactation Tools Discussed/Used Tools: Shells;Pump Shell Type: Inverted Breast pump type: Manual Pump Review: Setup, frequency, and cleaning;Milk Storage Initiated by:: Peri JeffersonL. Raffaele Derise RN IBCLC Date initiated:: 08/21/16   Consult Status Consult Status: Follow-up Date: 08/21/16 Follow-up type: In-patient    Renee DancerCARVER, Renee Orozco 08/21/2016, 7:27 AM

## 2016-08-21 NOTE — Discharge Instructions (Signed)

## 2016-08-22 MED ORDER — ACETAMINOPHEN 325 MG PO TABS: 650.0000 mg | ORAL_TABLET | ORAL | 0 refills | Status: DC | PRN

## 2016-08-22 MED ORDER — IBUPROFEN 800 MG PO TABS: 800.0000 mg | ORAL_TABLET | Freq: Three times a day (TID) | ORAL | 0 refills | Status: DC | PRN

## 2016-08-22 MED ORDER — SIMETHICONE 80 MG PO CHEW: 80.0000 mg | CHEWABLE_TABLET | ORAL | 0 refills | Status: DC | PRN

## 2016-08-22 MED ORDER — OXYCODONE HCL 5 MG PO TABS: 5.0000 mg | ORAL_TABLET | Freq: Four times a day (QID) | ORAL | 0 refills | Status: DC | PRN

## 2016-08-22 NOTE — Discharge Summary (Signed)
OB Discharge Summary     Patient Name: Renee Orozco DOB: 1985-01-15 MRN: [id number]  Date of admission: 08/20/2016 Delivering MD: Joen Laura C   Date of discharge: 08/22/2016  Admitting diagnosis: LABOR Intrauterine pregnancy: [redacted]w[redacted]d     Secondary diagnosis:  Active Problems:   Normal labor   NSVD (normal spontaneous vaginal delivery)  Additional problems: 3rd degree perineal, 2nd degree vaginal with labial tears bilaterally      Discharge diagnosis: Term Pregnancy Delivered                                                                                                Post partum procedures:none  Augmentation: AROM and Pitocin  Complications: None  Hospital course:  Onset of Labor With Vaginal Delivery     31 y.o. yo G1P1000 at [redacted]w[redacted]d was admitted in Active Labor on 08/20/2016. Patient had an uncomplicated labor course as follows:  Membrane Rupture Time/Date: 1:40 PM ,08/20/2016   Intrapartum Procedures: Episiotomy:                                           Lacerations:  3rd degree [4]  Patient had a delivery of a Viable infant. 08/20/2016  Information for the patient's newborn:  Amory, Zbikowski [[id number]]  Delivery Method: Vaginal, Spontaneous Delivery (Filed from Delivery Summary)    Pateint had an uncomplicated postpartum course.  She is ambulating, tolerating a regular diet, passing flatus, and urinating well. Vaginal repair healing appropriately without continued bleeding. Patient is discharged home in stable condition on 08/22/16.   Physical exam  Vitals:   08/21/16 0333 08/21/16 0800 08/21/16 1900 08/22/16 0615  BP: (!) 102/56 (!) 102/59 (!) 100/50 105/64  Pulse: 81 69 93 64  Resp: 20 18 18 18   Temp: 98.5 F (36.9 C) 98.4 F (36.9 C) 98 F (36.7 C) (!) 97.5 F (36.4 C)  TempSrc: Oral Oral Oral Oral  SpO2: 98%   97%  Weight:      Height:       General: alert, cooperative and no distress Lochia: appropriate Uterine Fundus: firm DVT  Evaluation: No evidence of DVT seen on physical exam.  Labs: Lab Results  Component Value Date   WBC 17.6 (H) 08/21/2016   HGB 9.8 (L) 08/21/2016   HCT 28.1 (L) 08/21/2016   MCV 86.7 08/21/2016   PLT 190 08/21/2016   CMP Latest Ref Rng & Units 01/16/2016  Glucose 65 - 99 mg/dL 84    Discharge instruction: per After Visit Summary and "Baby and Me Booklet".  After visit meds:  Allergies as of 08/22/2016   No Known Allergies     Medication List    STOP taking these medications   Doxylamine-Pyridoxine 10-10 MG Tbec Commonly known as:  DICLEGIS   ondansetron 4 MG disintegrating tablet Commonly known as:  ZOFRAN ODT   PRENATAL VITAMIN PO     TAKE these medications   acetaminophen 325 MG tablet Commonly known as:  TYLENOL Take 2 tablets (650  mg total) by mouth every 4 (four) hours as needed (for pain scale < 4).   ibuprofen 800 MG tablet Commonly known as:  ADVIL,MOTRIN Take 1 tablet (800 mg total) by mouth every 8 (eight) hours as needed.   oxyCODONE 5 MG immediate release tablet Commonly known as:  Oxy IR/ROXICODONE Take 1 tablet (5 mg total) by mouth every 6 (six) hours as needed for severe pain or breakthrough pain.   simethicone 80 MG chewable tablet Commonly known as:  MYLICON Chew 1 tablet (80 mg total) by mouth as needed for flatulence.       Diet: routine diet  Activity: Advance as tolerated. Pelvic rest for 6 weeks.   Outpatient follow up:4 weeks Follow up Appt:No future appointments. Follow up Visit:No Follow-up on file.  Postpartum contraception: IUD to be decided   Newborn Data: Live born female  Birth Weight: 7 lb 8.5 oz (3415 g) APGAR: 8, 9  Baby Feeding: Breast Disposition:home with mother   08/22/2016 [name], MD   OB FELLOW DISCHARGE ATTESTATION  I have seen and examined this patient. I agree with above documentation and have made edits as needed.   Caryl AdaJazma Willie Loy, DO OB Fellow 8:13 AM

## 2016-08-22 NOTE — Lactation Note (Signed)
This note was copied from a baby's chart. Lactation Consultation Note  Patient Name: Renee Orozco ZOXWR'UToday's Date: 08/22/2016 Reason for consult: Follow-up assessment Assisted mom with waking techniques and positioning baby in football hold.  Baby latches easily and well.  Good stimulation needed to keep baby active at breast.  Audible swallows noted.  Discussed milk coming to volume and treatment of engorgement.  OP lactation services and support reviewed and encouraged prn.  Maternal Data    Feeding Feeding Type: Breast Fed Length of feed: 10 min  LATCH Score Latch: Grasps breast easily, tongue down, lips flanged, rhythmical sucking.  Audible Swallowing: A few with stimulation  Type of Nipple: Everted at rest and after stimulation  Comfort (Breast/Nipple): Soft / non-tender  Hold (Positioning): Assistance needed to correctly position infant at breast and maintain latch.  LATCH Score: 8  Interventions Interventions: Breast feeding basics reviewed;Breast compression;Assisted with latch;Adjust position;Support pillows;Skin to skin;Breast massage  Lactation Tools Discussed/Used     Consult Status      Huston FoleyMOULDEN, Darline Faith S 08/22/2016, 9:36 AM

## 2016-08-22 NOTE — Lactation Note (Signed)
This note was copied from a baby's chart. Lactation Consultation Note  Patient Name: Boy Lourena SimmondsRebecca Renne ZOXWR'UToday's Date: 08/22/2016 Reason for consult: Follow-up assessment Pediatrician would like mom to be shown how to supplement.  Parents shown how to use a 5 french feeding tube taped to breast.  Baby was sleepy but took 7 mls of alimentum at breast.  Bili to be repeated at 1300.  Will follow up later today.  Maternal Data    Feeding Feeding Type: Formula Length of feed: 10 min  LATCH Score Latch: Grasps breast easily, tongue down, lips flanged, rhythmical sucking.  Audible Swallowing: Spontaneous and intermittent  Type of Nipple: Everted at rest and after stimulation  Comfort (Breast/Nipple): Soft / non-tender  Hold (Positioning): Assistance needed to correctly position infant at breast and maintain latch.  LATCH Score: 9  Interventions Interventions: Assisted with latch  Lactation Tools Discussed/Used Tools: 88F feeding tube / Syringe   Consult Status      Huston FoleyMOULDEN, Phillis Thackeray S 08/22/2016, 9:43 AM

## 2016-08-22 NOTE — Progress Notes (Signed)
Sitz bath brought into room, patient wants instructions at a later time she is resting.

## 2016-08-22 NOTE — Progress Notes (Signed)
Patient given and educated on use of the sitz bath, denies questions at this time.

## 2016-08-25 ENCOUNTER — Encounter: Payer: Self-pay | Admitting: Obstetrics & Gynecology

## 2016-08-25 ENCOUNTER — Ambulatory Visit (INDEPENDENT_AMBULATORY_CARE_PROVIDER_SITE_OTHER): Payer: Managed Care, Other (non HMO) | Admitting: Obstetrics & Gynecology

## 2016-08-25 MED ORDER — AMOXICILLIN-POT CLAVULANATE 875-125 MG PO TABS: 1.0000 | ORAL_TABLET | Freq: Two times a day (BID) | ORAL | 1 refills | Status: DC

## 2016-08-25 NOTE — Progress Notes (Signed)
   Subjective:    Patient ID: Renee SimmondsRebecca Orozco, female    DOB: [birth year], 31 y.o.   MRN: [id number]  HPI  Pt is a few days post NSVD with 3rd degree lac and bilateral vaginal Verdene Rio/labial lacs.  Pt feeling swelling, pressure, and smelling odor.  No heavy bleeging.  Nothing per vagina since delivery.  No fever.  Review of Systems  Gastrointestinal: Negative.   Genitourinary: Positive for pelvic pain, vaginal bleeding and vaginal pain.       Objective:   Physical Exam  Abdominal: Soft. She exhibits no distension. There is no tenderness.  Genitourinary:             Assessment & Plan:  31 yo female with laceration pain and discoloration.  1-mild infection of laceration site.  Will treat with Augmentin for 10 days 2-Patient to look at vulva daily for improvement 3-Call with fever, pain, worsening odor 4-RTC Friday for recheck

## 2016-08-26 ENCOUNTER — Encounter (HOSPITAL_COMMUNITY): Payer: Self-pay | Admitting: *Deleted

## 2016-08-27 ENCOUNTER — Encounter: Payer: Self-pay | Admitting: Advanced Practice Midwife

## 2016-08-27 ENCOUNTER — Ambulatory Visit (INDEPENDENT_AMBULATORY_CARE_PROVIDER_SITE_OTHER): Payer: Managed Care, Other (non HMO) | Admitting: Advanced Practice Midwife

## 2016-08-27 DIAGNOSIS — T148XXA Other injury of unspecified body region, initial encounter: Secondary | ICD-10-CM

## 2016-08-27 DIAGNOSIS — L089 Local infection of the skin and subcutaneous tissue, unspecified: Secondary | ICD-10-CM

## 2016-08-27 NOTE — Patient Instructions (Signed)

## 2016-08-27 NOTE — Progress Notes (Signed)
Subjective:     Patient ID: Renee Orozco, female   DOB: 1985/09/18, 31 y.o.   MRN: [id number]  HPI: Here for check of infected third degree lac. Taking ABX as directed. Pain much better. Odor resolved. Feels as if stitches are poking her.    Review of Systems  Constitutional: Negative for chills and fever.  Gastrointestinal: Negative for abdominal pain.  Genitourinary: Positive for vaginal bleeding (a little bit heavier, but light overall). Negative for vaginal discharge and vaginal pain.       Decreased vulvar swelling, pain. No odor.        Objective:   Physical Exam  Constitutional: She is oriented to person, place, and time. She appears well-developed and well-nourished. No distress.  Cardiovascular: Normal rate.   Pulmonary/Chest: Effort normal. No respiratory distress.  Abdominal: Soft. There is no tenderness.  Genitourinary: No labial fusion. There is no tenderness on the right labia. There is no tenderness on the left labia. There is bleeding (light) in the vagina. No erythema or tenderness in the vagina. No vaginal discharge found.  Genitourinary Comments: Area of eschar no longer present on right labia. No swelling, odor, drainage or tenderness. Sutures trimmed.    Neurological: She is alert and oriented to person, place, and time.  Skin: Skin is warm and dry.  Psychiatric: She has a normal mood and affect.  Nursing note and vitals reviewed.      Assessment:      1. Obstetrical laceration, third degree--Healing well   2. Infected laceration- Infection resolving - Complete course of ABX.      Plan:     F/F 3-4 weeks for PP visit Pelvic rest until after PP visit.   Katrinka Blazing, [place] 08/28/2016

## 2016-08-31 DIAGNOSIS — L089 Local infection of the skin and subcutaneous tissue, unspecified: Secondary | ICD-10-CM | POA: Insufficient documentation

## 2016-08-31 DIAGNOSIS — T148XXA Other injury of unspecified body region, initial encounter: Secondary | ICD-10-CM

## 2016-09-22 ENCOUNTER — Encounter: Payer: Self-pay | Admitting: Obstetrics & Gynecology

## 2016-09-22 ENCOUNTER — Ambulatory Visit (INDEPENDENT_AMBULATORY_CARE_PROVIDER_SITE_OTHER): Payer: 59 | Admitting: Obstetrics & Gynecology

## 2016-09-22 DIAGNOSIS — Z1389 Encounter for screening for other disorder: Secondary | ICD-10-CM

## 2016-09-22 NOTE — Progress Notes (Signed)
Post Partum Exam  Renee SimmondsRebecca Orozco is a 31 y.o. 831P1000 female who presents for a postpartum visit. She is 4 weeks postpartum following a spontaneous vaginal delivery. I have fully reviewed the prenatal and intrapartum course. The delivery was at 8051w4d gestational weeks.  Anesthesia: epidural. Postpartum course has been unremarkable. Baby's course has been unremarkable. Baby is feeding by breast. Bleeding no bleeding. Bowel function is normal. Bladder function is normal. Patient is not sexually active. Contraception method is IUD. Postpartum depression screening:neg (score 4)  The following portions of the patient's history were reviewed and updated as appropriate: allergies, current medications, past family history, past medical history, past social history, past surgical history and problem list.  Review of Systems Pertinent items noted in HPI and remainder of comprehensive ROS otherwise negative.    Objective:  Height 5\' 1"  (1.549 m), weight 185 lb (83.9 kg), currently breastfeeding.  General:  alert, cooperative and no distress   Breasts:  negative  Lungs: nml effort  Heart:  regular rate and rhythm  Abdomen: soft, non tender   Vulva:  normal  Vagina: laceration not fully healed.  .5 cm piece of granulation tissue at fourchete  Cervix:  retroverted  Corpus: normal  Adnexa:  not evaluated  Rectal Exam: Not performed.        Assessment:    Nml postpartum exam.  Granulation tisue at introitus  Plan:   Vaginal rest Silver nitrate to granulation tissue RTC 3 weeks

## 2016-10-14 ENCOUNTER — Encounter: Payer: Self-pay | Admitting: Obstetrics & Gynecology

## 2016-10-14 ENCOUNTER — Ambulatory Visit (INDEPENDENT_AMBULATORY_CARE_PROVIDER_SITE_OTHER): Payer: 59 | Admitting: Obstetrics & Gynecology

## 2016-10-14 VITALS — BP 109/71 | HR 79 | Ht 61.0 in | Wt 185.0 lb

## 2016-10-14 DIAGNOSIS — B373 Candidiasis of vulva and vagina: Secondary | ICD-10-CM | POA: Diagnosis not present

## 2016-10-14 DIAGNOSIS — B3731 Acute candidiasis of vulva and vagina: Secondary | ICD-10-CM

## 2016-10-14 MED ORDER — FLUCONAZOLE 150 MG PO TABS: 150.0000 mg | ORAL_TABLET | Freq: Once | ORAL | 0 refills | Status: AC

## 2016-10-14 MED ORDER — CLOTRIMAZOLE-BETAMETHASONE 1-0.05 % EX CREA: 1.0000 "application " | TOPICAL_CREAM | Freq: Two times a day (BID) | CUTANEOUS | 0 refills | Status: AC

## 2016-10-14 NOTE — Progress Notes (Signed)
   Subjective:    Patient ID: Renee Orozco, female    DOB: 02/07/1985, 31 y.o.   MRN: [id number]  HPI  31 yo female presents for evaluation of perineum.  Pt had infected midline laceration that improved with antibiotics.  Pt then had granulation tissue and sliver nitrate was used.  Pt has not had intercourse.  She has not had pain.  She did use dial soap recently at the beach and this is only hygiene change.  Pt not waering pads, not incontinent, no creams.    Review of Systems  Constitutional: Negative.   Cardiovascular: Negative.   Gastrointestinal: Negative.   Genitourinary: Negative.        Objective:   Physical Exam  Constitutional: She is oriented to person, place, and time. She appears well-developed and well-nourished. No distress.  HENT:  Head: Normocephalic and atraumatic.  Eyes: Conjunctivae are normal.  Pulmonary/Chest: Effort normal.  Abdominal: Soft.  Musculoskeletal: She exhibits no edema.  Neurological: She is alert and oriented to person, place, and time.  Skin: Skin is warm and dry.  Psychiatric: She has a normal mood and affect.  Vitals reviewed.    Laceration midline healed.  Tissue is thin but no pain and suture can't be felt.   New onset redness on perineum with raw, friable area above anus.  Vitals:   10/14/16 1457  BP: 109/71  Pulse: 79  Weight: 185 lb (83.9 kg)  Height:  (1.549 m)   Assessment & Plan:  31 yo female with allergic reaction and yeast of perineum.  1-Diflucan x1 2.  Lotrisome creams bid 3.  RTC 1 week. 4.  Vulvar hygiene reviewed.

## 2016-10-21 ENCOUNTER — Encounter: Payer: Self-pay | Admitting: Obstetrics & Gynecology

## 2016-10-21 ENCOUNTER — Ambulatory Visit (INDEPENDENT_AMBULATORY_CARE_PROVIDER_SITE_OTHER): Payer: 59 | Admitting: Obstetrics & Gynecology

## 2016-10-21 VITALS — BP 116/81 | HR 71 | Ht 61.0 in | Wt 185.0 lb

## 2016-10-21 DIAGNOSIS — Z23 Encounter for immunization: Secondary | ICD-10-CM

## 2016-10-21 DIAGNOSIS — Z3202 Encounter for pregnancy test, result negative: Secondary | ICD-10-CM | POA: Diagnosis not present

## 2016-10-21 DIAGNOSIS — Z30430 Encounter for insertion of intrauterine contraceptive device: Secondary | ICD-10-CM

## 2016-10-21 LAB — POCT URINE PREGNANCY: PREG TEST UR: NEGATIVE

## 2016-10-21 MED ORDER — LEVONORGESTREL 20 MCG/24HR IU IUD
INTRAUTERINE_SYSTEM | Freq: Once | INTRAUTERINE | Status: AC
  Administered 2016-10-21: 14:00:00 via INTRAUTERINE

## 2016-10-21 MED ORDER — LEVONORGESTREL 20 MCG/24HR IU IUD: 1.0000 | INTRAUTERINE_SYSTEM | Freq: Once | INTRAUTERINE | 0 refills | Status: DC

## 2016-10-21 NOTE — Progress Notes (Signed)
   Subjective:    Patient ID: Renee Orozco, female    DOB: August 17, 1985, 31 y.o.   MRN: [id number]  HPI 31 yo MW P32 (59 week old son) here for Mirena insertion. She has not had sex since her delivery. She was treated for a vulvar yeast infection at her last visit here. She has been checking with a mirror and believes that the area is better. She has no symptoms.   Review of Systems     Objective:   Physical Exam  UPT negative, consent signed, Time out procedure done. Cervix prepped with betadine and grasped with a single tooth tenaculum. Mirena was easily placed and the strings were cut to 3-4 cm. Uterus sounded to 9 cm. She tolerated the procedure well.   Assessment & Plan:  Contraception- Mirena Rec back up for 2 weeks

## 2016-12-08 ENCOUNTER — Telehealth: Payer: Self-pay | Admitting: *Deleted

## 2016-12-08 MED ORDER — DICLOXACILLIN SODIUM 250 MG PO CAPS: 250.0000 mg | ORAL_CAPSULE | Freq: Four times a day (QID) | ORAL | 0 refills | Status: AC

## 2016-12-08 NOTE — Telephone Encounter (Signed)
Pt called stating that she is nursing and has a hard knot area in one of her breast that is tender and cannot massage it out.  It has become painful and woke up with a low grade temp theis morning.  She has been applying heat also.  Encourage patient to continue to feed on that side, continue breast massage and heat.  Will send in RX for mastitis to her pharmacy.  She is instructed to call if no improvement.

## 2017-12-01 IMAGING — US US MFM OB DETAIL+14 WK
1 series · 14 of 28 positions shown · non-contrast
Comparison: none

[Series 1: us mfm ob detail+14 wk · 14 of 84 slices shown]
[im 4/84]
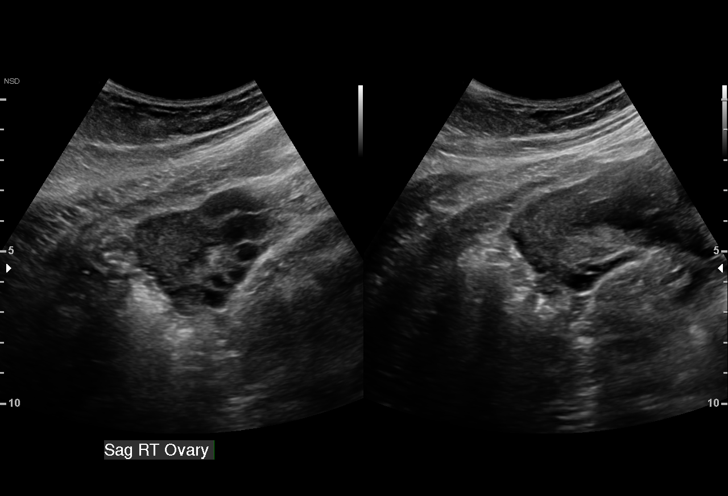
[im 10/84]
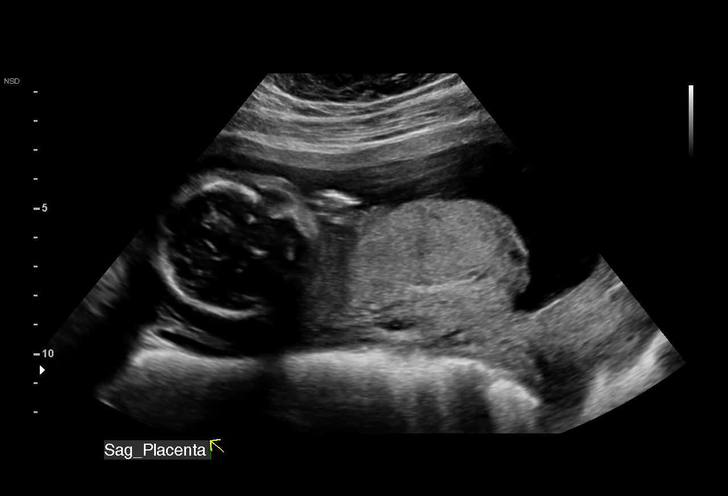
[im 16/84]
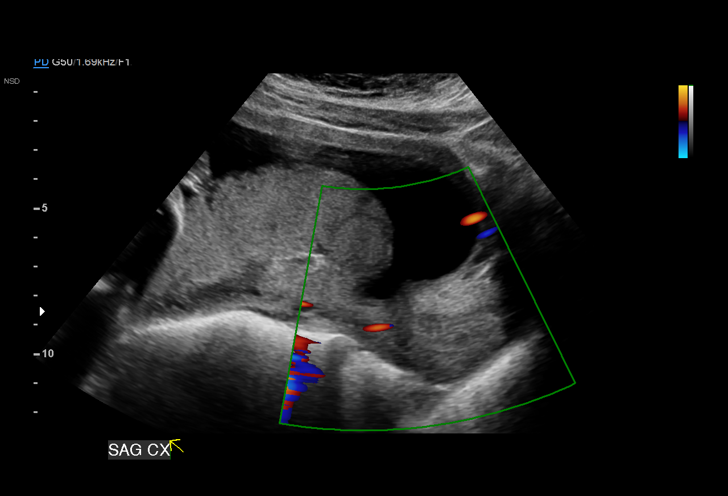
[im 22/84]
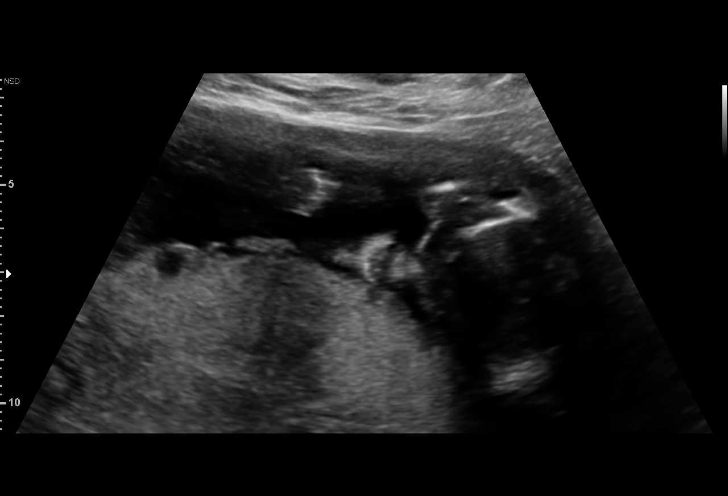
[im 28/84]
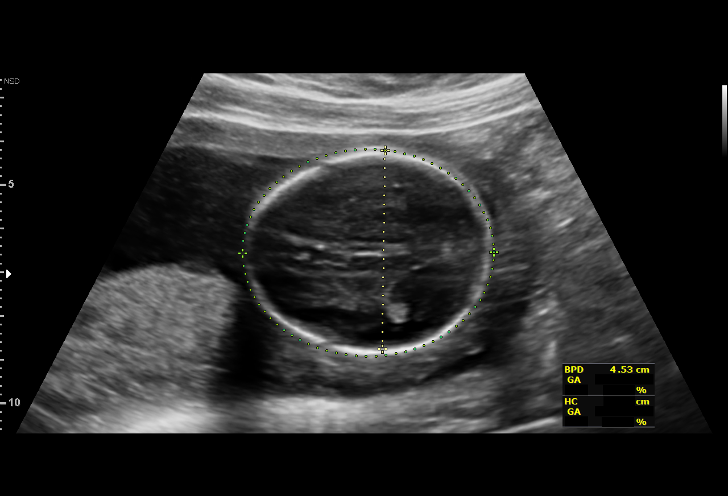
[im 34/84]
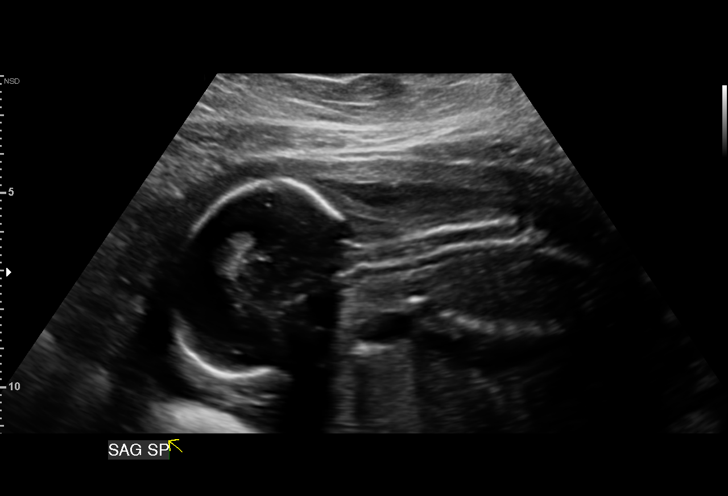
[im 40/84]
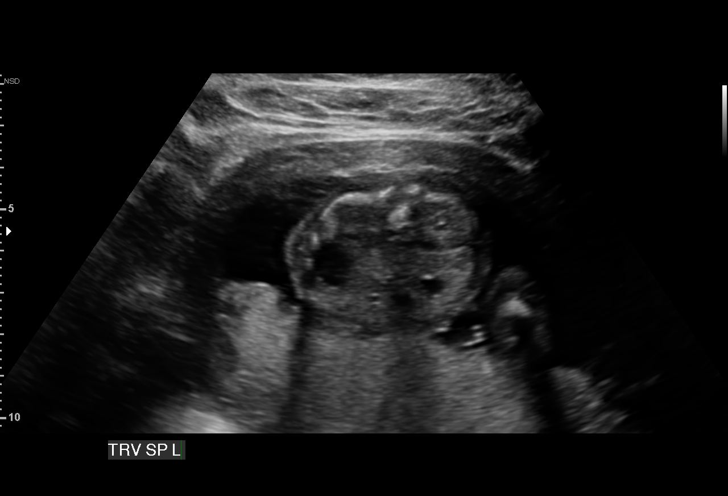
[im 47/84]
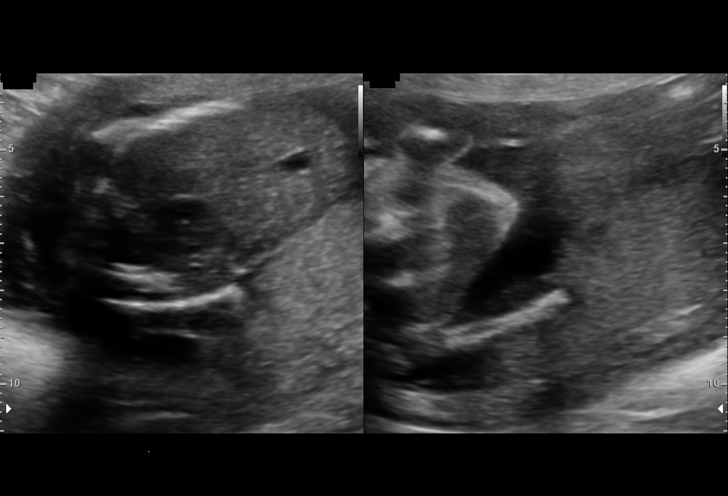
[im 53/84]
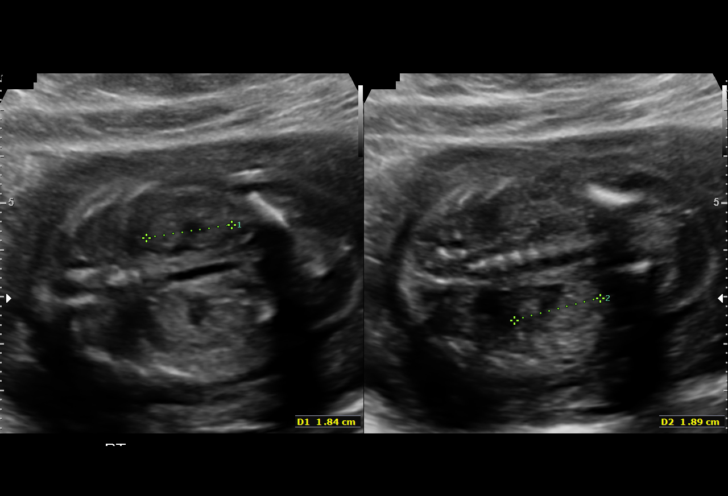
[im 59/84]
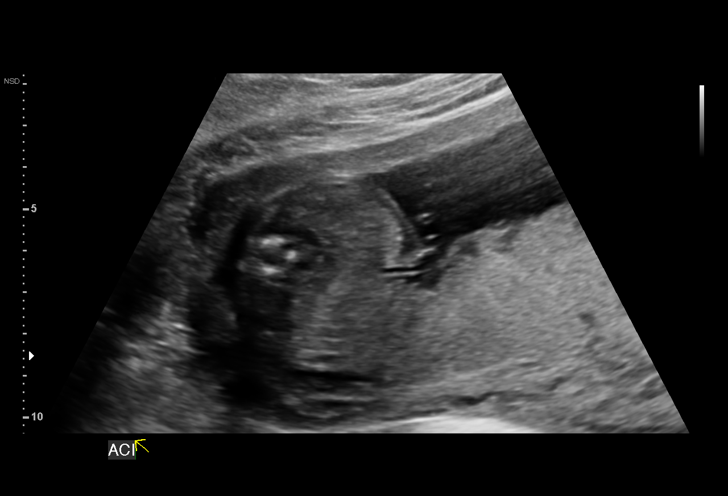
[im 65/84]
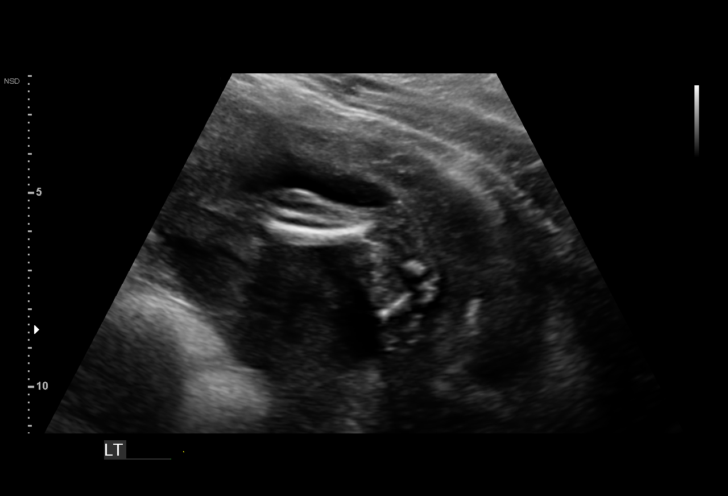
[im 71/84]
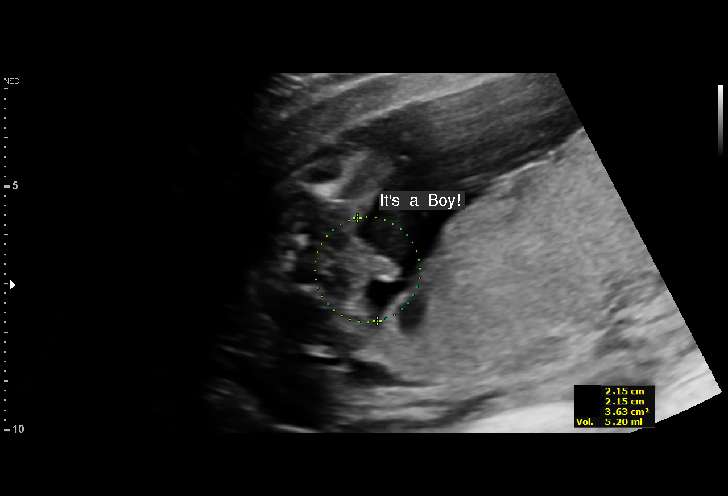
[im 77/84]
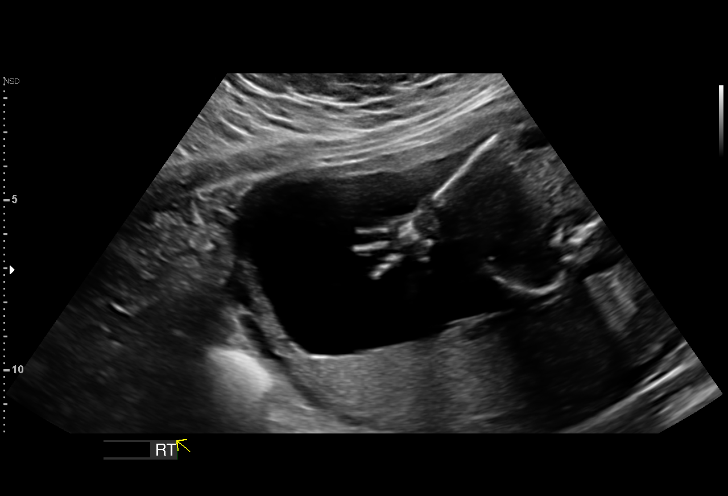
[im 84/84]
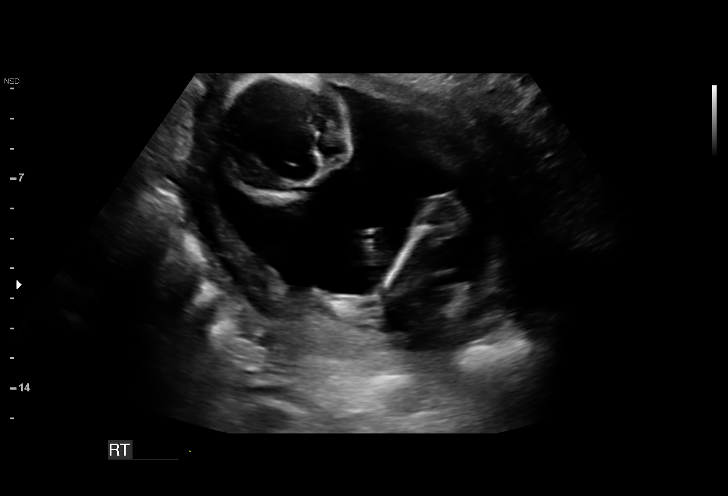

[14 of 28 positions shown; findings below may reference images not displayed]

1  WILLEMIEKE ALGRA            [id number]      [id number]     [id number]
Indications

19 weeks gestation of pregnancy
Encounter for antenatal screening for
malformations
Previous cervical surgery (LEEP)
Family history of congenital anomaly (FOB
Bilateral club feet)
OB History

Gravidity:    1
Fetal Evaluation

Num Of Fetuses:     1
Fetal Heart         133
Rate(bpm):
Cardiac Activity:   Observed
Presentation:       Transverse, head to maternal right
Placenta:           Posterior, low-lying, 1.7 cm from int os
P. Cord Insertion:  Visualized, central

Amniotic Fluid
AFI FV:      Subjectively within normal limits

Largest Pocket(cm)
4.9
Biometry

BPD:      45.8  mm     G. Age:  19w 6d         83  %    CI:        76.34   %   70 - 86
FL/HC:      16.4   %   16.1 -
HC:      166.1  mm     G. Age:  19w 2d         57  %    HC/AC:      1.10       1.09 -
AC:      150.9  mm     G. Age:  20w 2d         84  %    FL/BPD:     59.6   %
FL:       27.3  mm     G. Age:  18w 2d         22  %    FL/AC:      18.1   %   20 - 24
HUM:      27.9  mm     G. Age:  19w 0d         49  %
CER:      19.7  mm     G. Age:  18w 6d         46  %

Est. FW:     291  gm    0 lb 10 oz      52  %
Gestational Age

LMP:           19w 0d       Date:   11/12/15                 EDD:   08/18/16
U/S Today:     19w 3d                                        EDD:   08/15/16
Best:          19w 0d    Det. By:   LMP  (11/12/15)          EDD:   08/18/16
Anatomy

Cranium:               Appears normal         Aortic Arch:            Appears normal
Cavum:                 Appears normal         Ductal Arch:            Appears normal
Ventricles:            Appears normal         Diaphragm:              Appears normal
Choroid Plexus:        Appears normal         Stomach:                Appears normal, left
sided
Cerebellum:            Appears normal         Abdomen:                Appears normal
Posterior Fossa:       Appears normal         Abdominal Wall:         Appears nml (cord
insert, abd wall)
Nuchal Fold:           Appears normal         Cord Vessels:           Appears normal (3
vessel cord)
Face:                  Orbits appear          Kidneys:                Appear normal
normal
Lips:                  Appears normal         Bladder:                Appears normal
Palate:                Not well visualized    Spine:                  Appears normal
Heart:                 Appears normal         Upper Extremities:      Visualized
(4CH, axis, and situs
RVOT:                  Not well visualized    Lower Extremities:      Visualized
LVOT:                  Not well visualized

Other:  Parents do not wish to know sex of fetus. Open Rt hand visualized.
Heels visualized. Nasal bone visualized.
Cervix Uterus Adnexa

Cervix
Length:            3.8  cm.
Normal appearance by transabdominal scan.

Left Ovary
Within normal limits.

Right Ovary
Within normal limits.

Adnexa:       No abnormality visualized.
Myomas

Site                     L(cm)      W(cm)      D(cm)      Location
Posterior
Blood Flow                 RI        PI       Comments

Impression

Single IUP at 19w 0d
Family history of club feet (father of the baby's father)
Limited views of the fetal heart obtained (outflow tracts)
The remainder of the fetal anatomy appears normal
Posterior, low lying placenta - the leading edge of the
placenta is 1.7 cm from the internal os
Normal amniotic fluid volume
Recommendations

Recommend follow-up ultrasound examination in 4 weeks to
complete anatomy; reevaluate the placental location
